# Patient Record
Sex: Female | Born: 1955
Health system: Southern US, Community
[De-identification: ages and names within clinical notes are randomized; demographics above are authoritative.]

## PROBLEM LIST (undated history)

## (undated) ENCOUNTER — Emergency Department (HOSPITAL_BASED_OUTPATIENT_CLINIC_OR_DEPARTMENT_OTHER): Admission: EM | Payer: No Typology Code available for payment source | Source: Home / Self Care

## (undated) DIAGNOSIS — I10 Essential (primary) hypertension: Secondary | ICD-10-CM

---

## 2006-11-01 ENCOUNTER — Emergency Department (HOSPITAL_COMMUNITY): Admission: EM | Admit: 2006-11-01 | Discharge: 2006-11-01 | Payer: Self-pay | Admitting: Emergency Medicine

## 2009-11-08 ENCOUNTER — Ambulatory Visit (HOSPITAL_COMMUNITY): Admission: RE | Admit: 2009-11-08 | Discharge: 2009-11-08 | Payer: Self-pay | Admitting: Family Medicine

## 2010-10-05 ENCOUNTER — Other Ambulatory Visit (HOSPITAL_COMMUNITY): Payer: Self-pay | Admitting: *Deleted

## 2010-11-23 ENCOUNTER — Other Ambulatory Visit (HOSPITAL_COMMUNITY): Payer: Self-pay | Admitting: Family Medicine

## 2010-11-23 DIAGNOSIS — Z1231 Encounter for screening mammogram for malignant neoplasm of breast: Secondary | ICD-10-CM

## 2010-12-06 ENCOUNTER — Ambulatory Visit (HOSPITAL_COMMUNITY)
Admission: RE | Admit: 2010-12-06 | Discharge: 2010-12-06 | Disposition: A | Discharge status: Home / Self Care | Payer: Self-pay | Source: Ambulatory Visit | Attending: Family Medicine | Admitting: Family Medicine

## 2010-12-06 DIAGNOSIS — Z1231 Encounter for screening mammogram for malignant neoplasm of breast: Secondary | ICD-10-CM

## 2011-01-01 ENCOUNTER — Emergency Department (HOSPITAL_BASED_OUTPATIENT_CLINIC_OR_DEPARTMENT_OTHER)
Admission: EM | Admit: 2011-01-01 | Discharge: 2011-01-01 | Disposition: A | Discharge status: Home / Self Care | Payer: Self-pay | Attending: Emergency Medicine | Admitting: Emergency Medicine

## 2011-01-01 ENCOUNTER — Other Ambulatory Visit: Payer: Self-pay

## 2011-01-01 ENCOUNTER — Encounter: Payer: Self-pay | Admitting: *Deleted

## 2011-01-01 ENCOUNTER — Emergency Department (INDEPENDENT_AMBULATORY_CARE_PROVIDER_SITE_OTHER): Payer: Self-pay

## 2011-01-01 DIAGNOSIS — R109 Unspecified abdominal pain: Secondary | ICD-10-CM | POA: Insufficient documentation

## 2011-01-01 DIAGNOSIS — R142 Eructation: Secondary | ICD-10-CM | POA: Insufficient documentation

## 2011-01-01 DIAGNOSIS — R143 Flatulence: Secondary | ICD-10-CM

## 2011-01-01 DIAGNOSIS — R141 Gas pain: Secondary | ICD-10-CM | POA: Insufficient documentation

## 2011-01-01 LAB — DIFFERENTIAL
Basophils Absolute: 0 10*3/uL (ref 0.0–0.1)
Basophils Relative: 1 % (ref 0–1)
Lymphs Abs: 2.1 10*3/uL (ref 0.7–4.0)
Neutro Abs: 1.7 10*3/uL (ref 1.7–7.7)
Neutrophils Relative %: 40 % — ABNORMAL LOW (ref 43–77)

## 2011-01-01 LAB — COMPREHENSIVE METABOLIC PANEL
ALT: 17 U/L (ref 0–35)
AST: 19 U/L (ref 0–37)
Albumin: 3.7 g/dL (ref 3.5–5.2)
BUN: 11 mg/dL (ref 6–23)
Calcium: 9.9 mg/dL (ref 8.4–10.5)
Total Protein: 7.2 g/dL (ref 6.0–8.3)

## 2011-01-01 LAB — CBC
HCT: 39.4 % (ref 36.0–46.0)
Hemoglobin: 13.5 g/dL (ref 12.0–15.0)
Platelets: 198 10*3/uL (ref 150–400)
RDW: 13.1 % (ref 11.5–15.5)
WBC: 4.3 10*3/uL (ref 4.0–10.5)

## 2011-01-01 LAB — URINALYSIS, ROUTINE W REFLEX MICROSCOPIC
Bilirubin Urine: NEGATIVE
Protein, ur: NEGATIVE mg/dL
Urobilinogen, UA: 0.2 mg/dL (ref 0.0–1.0)

## 2011-01-01 NOTE — ED Provider Notes (Signed)
History     CSN: [id number] Arrival date & time: 01/01/2011  9:46 AM  Chief Complaint  Patient presents with  . Bloated   Patient is a 55 y.o. female presenting with abdominal pain. The history is provided by the patient.  Abdominal Pain The primary symptoms of the illness include abdominal pain. The primary symptoms of the illness do not include fever, nausea or vomiting. Episode onset: Symptoms started about three weeks ago. She saw her PCP who could not find a cause, and ordered no treatments. The onset of the illness was gradual (She describes a vague discomfort of her entire abdomen, and a sense that she is gaining weight in the area of the upper abdomen and flank area). The problem has not changed since onset. The patient has not had a change in bowel habit. Symptoms associated with the illness do not include anorexia or constipation. Associated symptoms comments: Pain is worse after eating, better after a bowel movement - although she denies constipation or diarrhea. Pain is 0/10 currently, 2/10 at the worst..    History reviewed. No pertinent past medical history.  Past Surgical History  Procedure Date  . Cesarean section     History reviewed. No pertinent family history.  History  Substance Use Topics  . Smoking status: Never Smoker   . Smokeless tobacco: Not on file  . Alcohol Use: No    OB History    Grav Para Term Preterm Abortions TAB SAB Ect Mult Living                  Review of Systems  Constitutional: Negative for fever.  Gastrointestinal: Positive for abdominal pain. Negative for nausea, vomiting, constipation and anorexia.  All other systems reviewed and are negative.    Physical Exam  BP 147/87  Pulse 80  Temp(Src) 98.4 F (36.9 C) (Oral)  Resp 18  Ht 5\' 8"  (1.727 m)  Wt 158 lb (71.668 kg)  BMI 24.02 kg/m2  SpO2 100%  Physical Exam  Nursing note and vitals reviewed. Constitutional: She is oriented to person, place, and time. She appears  well-developed and well-nourished. No distress.  HENT:  Head: Normocephalic and atraumatic.  Right Ear: External ear normal.  Left Ear: External ear normal.  Nose: Nose normal.  Mouth/Throat: Oropharynx is clear and moist.  Eyes: Conjunctivae and EOM are normal. Pupils are equal, round, and reactive to light. No scleral icterus.  Neck: Normal range of motion. Neck supple. No JVD present.  Cardiovascular: Normal rate, regular rhythm and normal heart sounds.   No murmur heard. Pulmonary/Chest: Effort normal and breath sounds normal. She has no wheezes. She has no rales. She exhibits no tenderness.  Abdominal: Soft. Bowel sounds are normal. She exhibits no mass. There is tenderness. There is no rebound and no guarding.       Mild RUQ tenderness with +/- Murphy Sign.  Musculoskeletal: Normal range of motion. She exhibits no edema.  Lymphadenopathy:    She has no cervical adenopathy.  Neurological: She is alert and oriented to person, place, and time. She has normal reflexes. No cranial nerve deficit. Coordination normal.  Skin: Skin is warm and dry. No rash noted.  Psychiatric: She has a normal mood and affect.    ED Course  Procedures  Date: 01/01/2011  Rate: 70  Rhythm: normal sinus rhythm  QRS Axis: normal  Intervals: normal  ST/T Wave abnormalities: normal  Conduction Disutrbances:none Incomplete right bundle branch block  Narrative Interpretation: Incomplete right bundle branch  block, otherwise normal ECG  Old EKG Reviewed: none available   MDM Nonspecific abdominal pain, but with increased pain with eating, and RUQ tenderness, will get [place] to rule out cholelithiasis. Work-up unremarkable. Will treat for possible GERD or gastritis. Labs and x-rays reviewed, images viewed by me. Results for orders placed during the hospital encounter of 01/01/11  CBC      Component Value Range   WBC 4.3  4.0 - 10.5 (K/uL)   RBC 4.64  3.87 - 5.11 (MIL/uL)   Hemoglobin 13.5  12.0 - 15.0  (g/dL)   HCT 40.9  81.1 - 91.4 (%)   MCV 84.9  78.0 - 100.0 (fL)   MCH 29.1  26.0 - 34.0 (pg)   MCHC 34.3  30.0 - 36.0 (g/dL)   RDW 78.2  95.6 - 21.3 (%)   Platelets 198  150 - 400 (K/uL)  DIFFERENTIAL      Component Value Range   Neutrophils Relative 40 (*) 43 - 77 (%)   Neutro Abs 1.7  1.7 - 7.7 (K/uL)   Lymphocytes Relative 48 (*) 12 - 46 (%)   Lymphs Abs 2.1  0.7 - 4.0 (K/uL)   Monocytes Relative 10  3 - 12 (%)   Monocytes Absolute 0.4  0.1 - 1.0 (K/uL)   Eosinophils Relative 1  0 - 5 (%)   Eosinophils Absolute 0.1  0.0 - 0.7 (K/uL)   Basophils Relative 1  0 - 1 (%)   Basophils Absolute 0.0  0.0 - 0.1 (K/uL)  COMPREHENSIVE METABOLIC PANEL      Component Value Range   Sodium 142  135 - 145 (mEq/L)   Potassium 3.6  3.5 - 5.1 (mEq/L)   Chloride 105  96 - 112 (mEq/L)   CO2 29  19 - 32 (mEq/L)   Glucose, Bld 87  70 - 99 (mg/dL)   BUN 11  6 - 23 (mg/dL)   Creatinine, Ser 0.86  0.50 - 1.10 (mg/dL)   Calcium 9.9  8.4 - 57.8 (mg/dL)   Total Protein 7.2  6.0 - 8.3 (g/dL)   Albumin 3.7  3.5 - 5.2 (g/dL)   AST 19  0 - 37 (U/L)   ALT 17  0 - 35 (U/L)   Alkaline Phosphatase 56  39 - 117 (U/L)   Total Bilirubin 0.9  0.3 - 1.2 (mg/dL)   GFR calc non Af Amer >60  >60 (mL/min)   GFR calc Af Amer >60  >60 (mL/min)  LIPASE, BLOOD      Component Value Range   Lipase 24  11 - 59 (U/L)  URINALYSIS, ROUTINE W REFLEX MICROSCOPIC      Component Value Range   Color, Urine YELLOW  YELLOW    Appearance CLEAR  CLEAR    Specific Gravity, Urine 1.014  1.005 - 1.030    pH 7.0  5.0 - 8.0    Glucose, UA NEGATIVE  NEGATIVE (mg/dL)   Hgb urine dipstick TRACE (*) NEGATIVE    Bilirubin Urine NEGATIVE  NEGATIVE    Ketones, ur NEGATIVE  NEGATIVE (mg/dL)   Protein, ur NEGATIVE  NEGATIVE (mg/dL)   Urobilinogen, UA 0.2  0.0 - 1.0 (mg/dL)   Nitrite NEGATIVE  NEGATIVE    Leukocytes, UA NEGATIVE  NEGATIVE   URINE MICROSCOPIC-ADD ON      Component Value Range   RBC / HPF 0-2  <3 (RBC/hpf)   US Abdomen  Complete  01/01/2011  *RADIOLOGY REPORT*  Clinical Data:  Abdominal pain and bloating  ABDOMINAL ULTRASOUND COMPLETE  Comparison:  None.  Findings:  Gallbladder:  No gallstones, gallbladder wall thickening, or pericholecystic fluid. Negative sonographic Murphy's sign.  Common Bile Duct:  Within normal limits in caliber. Measures 1.2 mm.  Liver: No focal mass lesion identified.  Within normal limits in parenchymal echogenicity.  IVC:  Appears normal.  Pancreas:  No abnormality identified.  Spleen:  Within normal limits in size and echotexture.  Right kidney:  Normal in size and parenchymal echogenicity.  No evidence of mass or hydronephrosis.  Left kidney:  Normal in size and parenchymal echogenicity.  No evidence of mass or hydronephrosis.  Abdominal Aorta:  No aneurysm identified.  IMPRESSION: Negative abdominal ultrasound.  Original Report Authenticated By: Brandon Melnick, M.D.   Ms Digital Screening  12/07/2010  DG [facility] MAMMOGRAM Bilateral CC and MLO view(s) were taken.  DIGITAL SCREENING MAMMOGRAM WITH CAD: The breast tissue is extremely dense.  No masses or malignant type calcifications are identified.   Compared with prior studies.  Images were processed with CAD.  IMPRESSION: No specific mammographic evidence of malignancy.  Next screening mammogram is recommended in one  year.  A result letter of this screening mammogram will be mailed directly to the patient.  ASSESSMENT: Negative - BI-RADS 1  Screening mammogram in 1 year. ,      Dione Booze, MD 01/01/11 1354

## 2011-01-01 NOTE — ED Notes (Signed)
Pt amb to room 5 with quick steady gait smiling in nad.   Pt reports feeling bloated and like she has gained weight over the last week.  States her bm's are normal and she is passing gas, denies any fevers, n/v/d or other c/o.

## 2012-01-16 ENCOUNTER — Other Ambulatory Visit (HOSPITAL_COMMUNITY): Payer: Self-pay | Admitting: Family Medicine

## 2012-01-16 DIAGNOSIS — Z1231 Encounter for screening mammogram for malignant neoplasm of breast: Secondary | ICD-10-CM

## 2012-02-01 ENCOUNTER — Ambulatory Visit (HOSPITAL_COMMUNITY)
Admission: RE | Admit: 2012-02-01 | Discharge: 2012-02-01 | Disposition: A | Discharge status: Home / Self Care | Payer: Self-pay | Source: Ambulatory Visit | Attending: Family Medicine | Admitting: Family Medicine

## 2012-02-01 DIAGNOSIS — Z1231 Encounter for screening mammogram for malignant neoplasm of breast: Secondary | ICD-10-CM

## 2012-02-26 DIAGNOSIS — H9192 Unspecified hearing loss, left ear: Secondary | ICD-10-CM | POA: Insufficient documentation

## 2012-07-13 IMAGING — US US ABDOMEN COMPLETE
1 series · 14 of 25 positions shown · non-contrast
Comparison: None.

CLINICAL DATA: Abdominal pain and bloating

ABDOMINAL ULTRASOUND COMPLETE

[Series 1: us abdomen complete · 0.26mm/px · 14 of 73 slices shown]
[im 1/73]
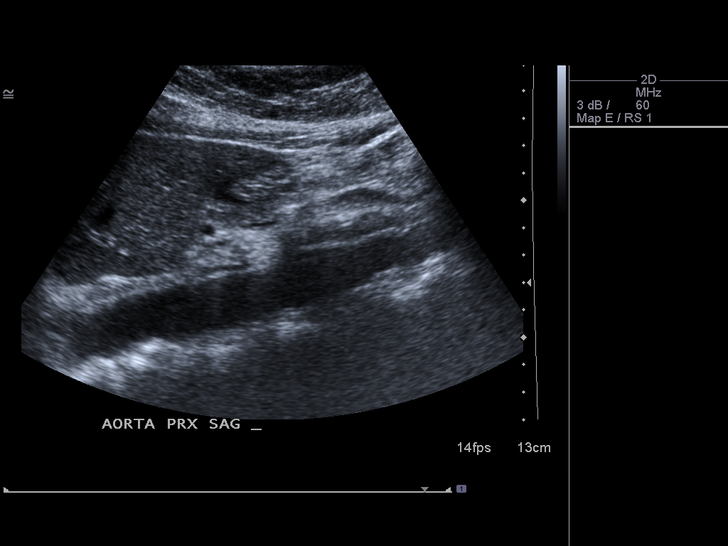
[im 7/73]
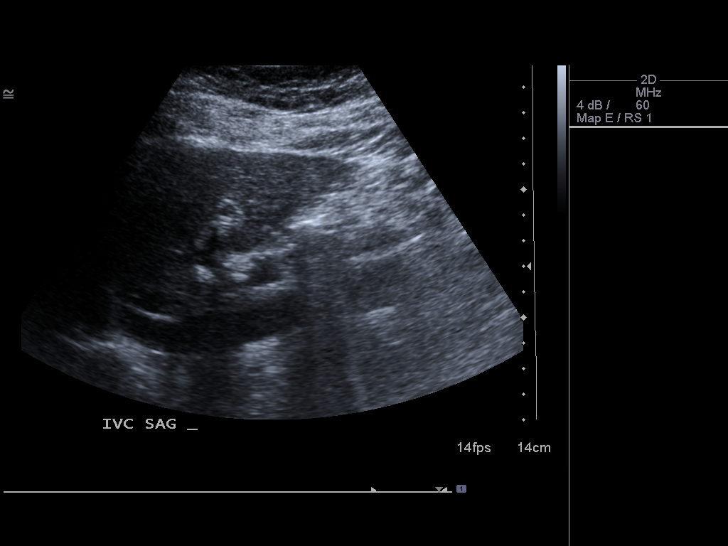
[im 13/73]
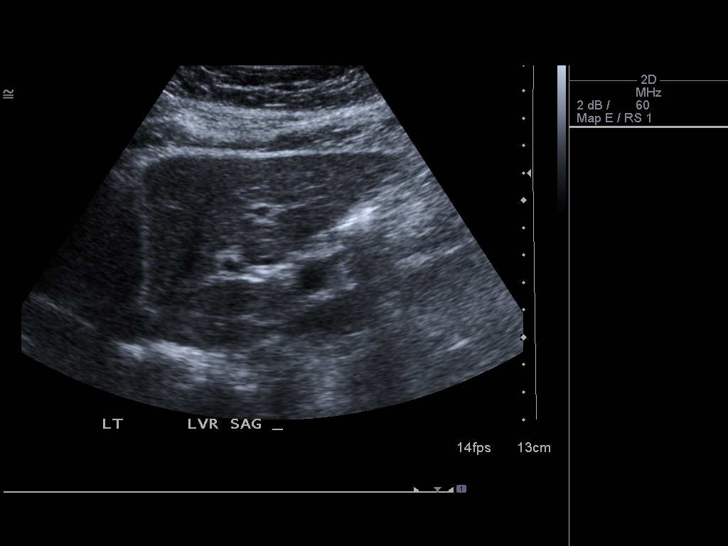
[im 19/73]
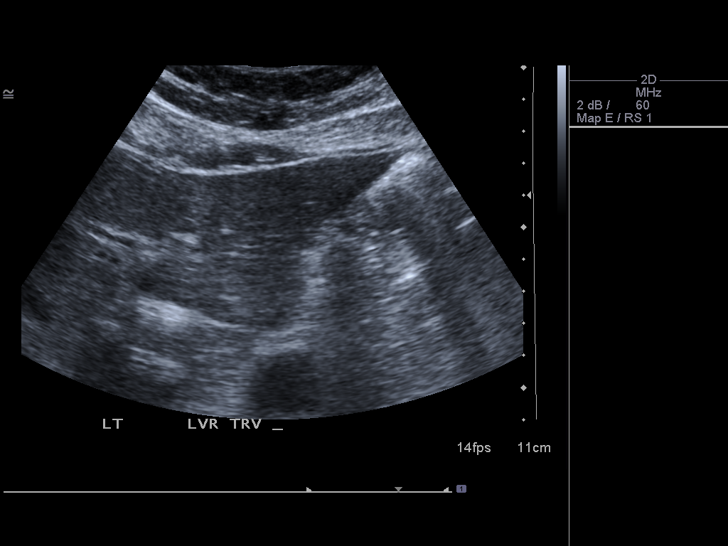
[im 25/73]
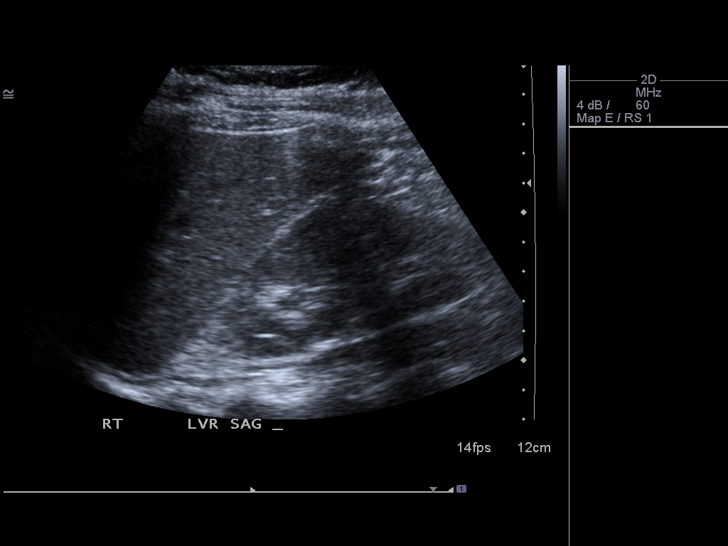
[im 28/73]
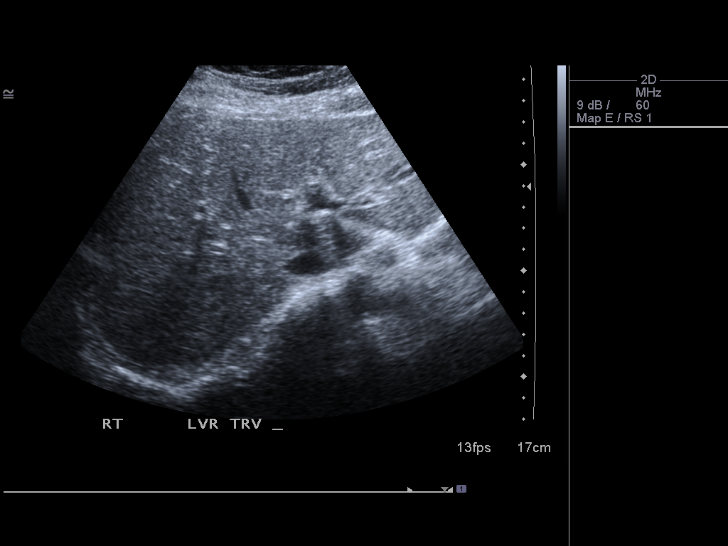
[im 34/73]
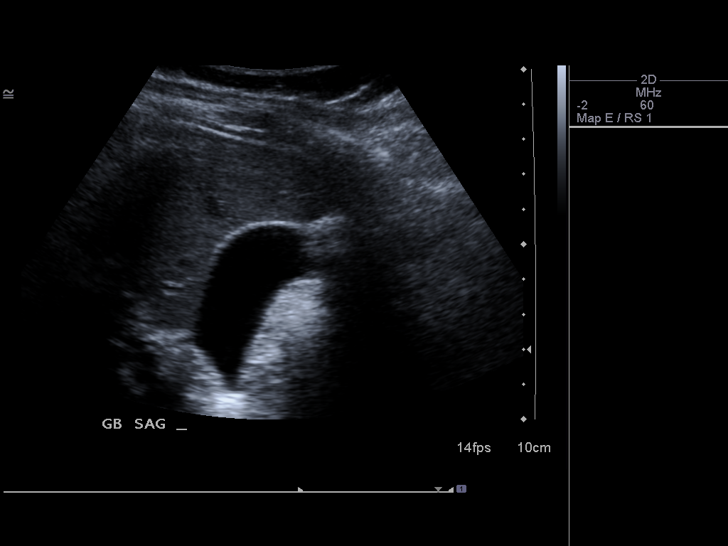
[im 40/73]
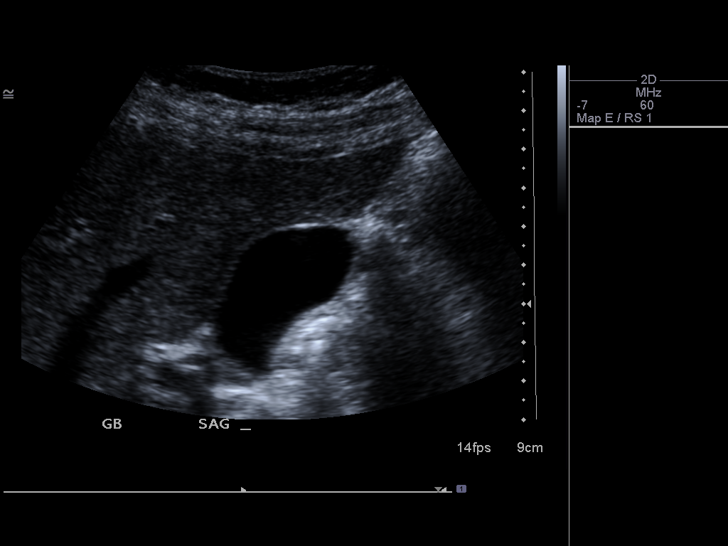
[im 46/73]
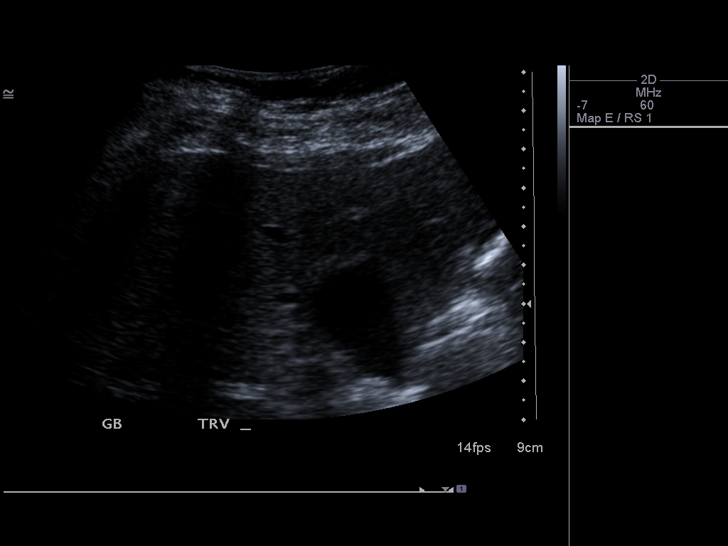
[im 49/73]
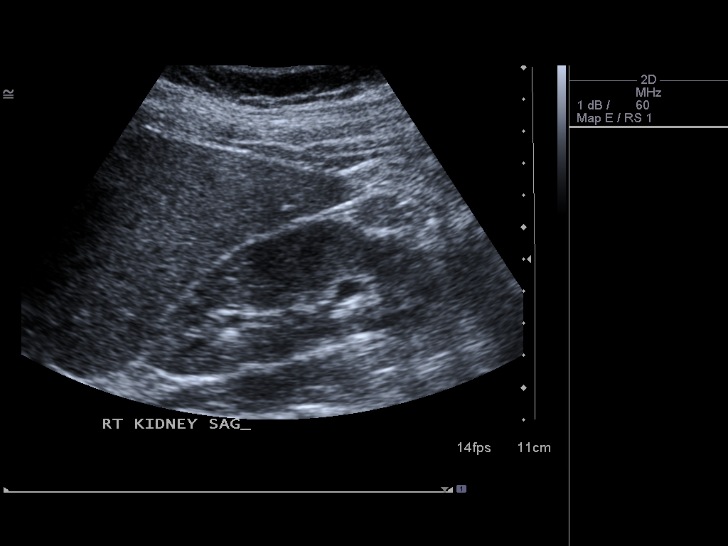
[im 55/73]
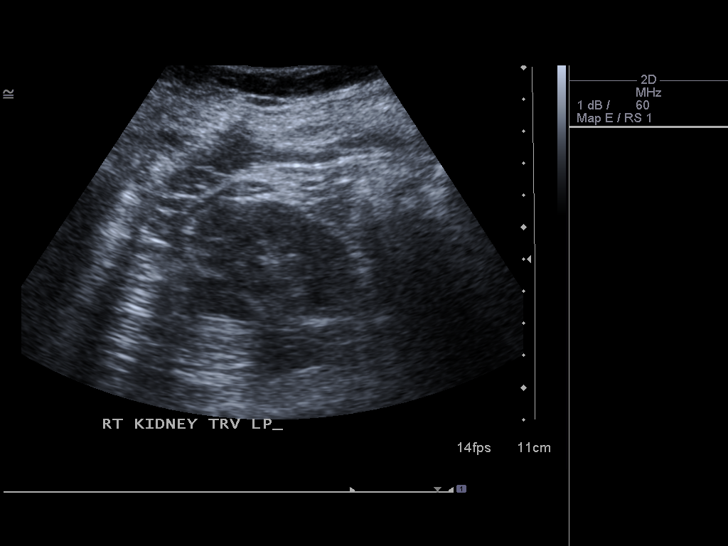
[im 61/73]
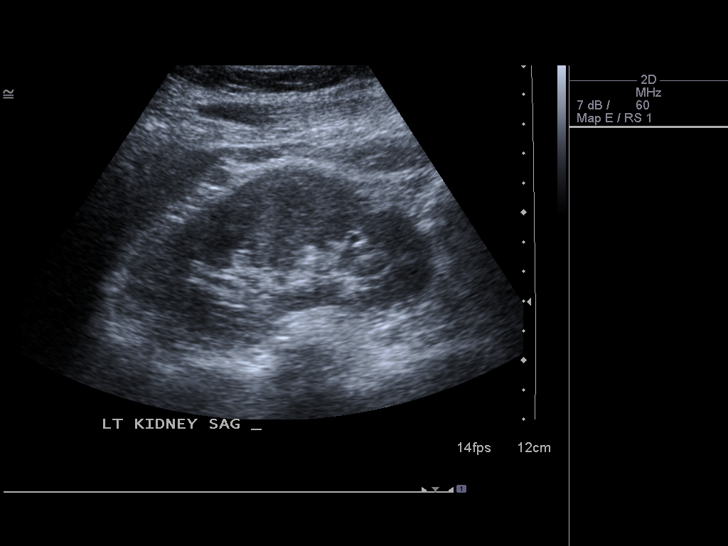
[im 67/73]
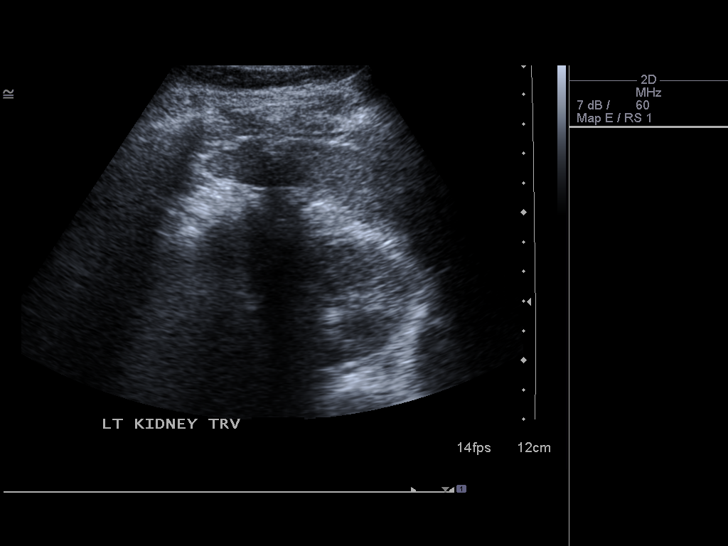
[im 73/73]
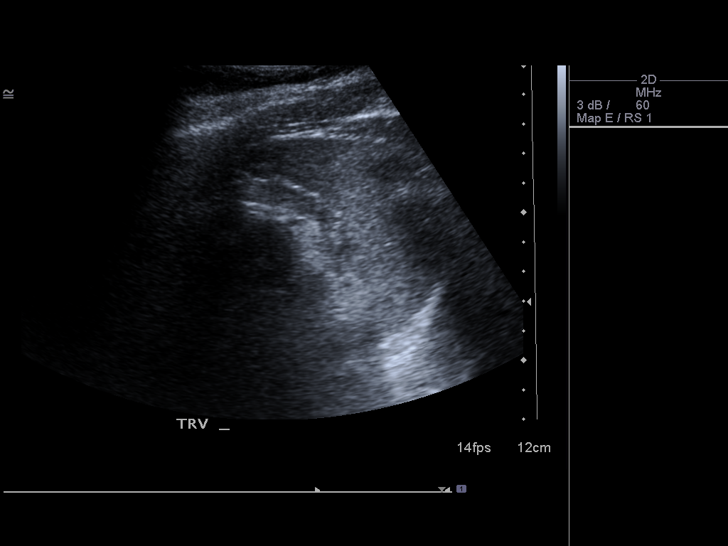

[14 of 25 positions shown; findings below may reference images not displayed]

FINDINGS: Gallbladder:  No gallstones, gallbladder wall thickening, or
pericholecystic fluid. Negative sonographic Murphy's sign.

Common Bile Duct:  Within normal limits in caliber. Measures
mm.

Liver: No focal mass lesion identified.  Within normal limits in
parenchymal echogenicity.

IVC:  Appears normal.

Pancreas:  No abnormality identified.

Spleen:  Within normal limits in size and echotexture.

Right kidney:  Normal in size and parenchymal echogenicity.  No
evidence of mass or hydronephrosis.

Left kidney:  Normal in size and parenchymal echogenicity.  No
evidence of mass or hydronephrosis.

Abdominal Aorta:  No aneurysm identified.
IMPRESSION: Negative abdominal ultrasound.

## 2012-12-30 ENCOUNTER — Other Ambulatory Visit (HOSPITAL_COMMUNITY): Payer: Self-pay | Admitting: Family Medicine

## 2012-12-30 DIAGNOSIS — Z1231 Encounter for screening mammogram for malignant neoplasm of breast: Secondary | ICD-10-CM

## 2013-02-02 ENCOUNTER — Ambulatory Visit (HOSPITAL_COMMUNITY)
Admission: RE | Admit: 2013-02-02 | Discharge: 2013-02-02 | Disposition: A | Discharge status: Home / Self Care | Payer: No Typology Code available for payment source | Source: Ambulatory Visit | Attending: Family Medicine | Admitting: Family Medicine

## 2013-02-02 DIAGNOSIS — Z1231 Encounter for screening mammogram for malignant neoplasm of breast: Secondary | ICD-10-CM | POA: Insufficient documentation

## 2014-02-24 ENCOUNTER — Other Ambulatory Visit (HOSPITAL_COMMUNITY): Payer: Self-pay | Admitting: *Deleted

## 2014-02-24 DIAGNOSIS — Z1231 Encounter for screening mammogram for malignant neoplasm of breast: Secondary | ICD-10-CM

## 2014-02-26 ENCOUNTER — Ambulatory Visit (HOSPITAL_COMMUNITY)
Admission: RE | Admit: 2014-02-26 | Discharge: 2014-02-26 | Disposition: A | Discharge status: Home / Self Care | Payer: 59 | Source: Ambulatory Visit | Attending: *Deleted | Admitting: *Deleted

## 2014-02-26 DIAGNOSIS — Z1231 Encounter for screening mammogram for malignant neoplasm of breast: Secondary | ICD-10-CM | POA: Insufficient documentation

## 2014-04-04 ENCOUNTER — Emergency Department (HOSPITAL_BASED_OUTPATIENT_CLINIC_OR_DEPARTMENT_OTHER)
Admission: EM | Admit: 2014-04-04 | Discharge: 2014-04-04 | Disposition: A | Discharge status: Home / Self Care | Payer: 59 | Attending: Emergency Medicine | Admitting: Emergency Medicine

## 2014-04-04 ENCOUNTER — Encounter (HOSPITAL_BASED_OUTPATIENT_CLINIC_OR_DEPARTMENT_OTHER): Payer: Self-pay | Admitting: *Deleted

## 2014-04-04 DIAGNOSIS — M79604 Pain in right leg: Secondary | ICD-10-CM | POA: Diagnosis present

## 2014-04-04 DIAGNOSIS — Z7951 Long term (current) use of inhaled steroids: Secondary | ICD-10-CM | POA: Diagnosis not present

## 2014-04-04 DIAGNOSIS — Z79899 Other long term (current) drug therapy: Secondary | ICD-10-CM | POA: Insufficient documentation

## 2014-04-04 DIAGNOSIS — Z791 Long term (current) use of non-steroidal anti-inflammatories (NSAID): Secondary | ICD-10-CM | POA: Insufficient documentation

## 2014-04-04 DIAGNOSIS — Z792 Long term (current) use of antibiotics: Secondary | ICD-10-CM | POA: Insufficient documentation

## 2014-04-04 DIAGNOSIS — M7651 Patellar tendinitis, right knee: Secondary | ICD-10-CM | POA: Diagnosis not present

## 2014-04-04 DIAGNOSIS — M791 Myalgia: Secondary | ICD-10-CM | POA: Insufficient documentation

## 2014-04-04 DIAGNOSIS — I10 Essential (primary) hypertension: Secondary | ICD-10-CM | POA: Diagnosis not present

## 2014-04-04 DIAGNOSIS — M76899 Other specified enthesopathies of unspecified lower limb, excluding foot: Secondary | ICD-10-CM

## 2014-04-04 HISTORY — DX: Essential (primary) hypertension: I10

## 2014-04-04 MED ORDER — TRAMADOL HCL 50 MG PO TABS: 50.0000 mg | ORAL_TABLET | Freq: Four times a day (QID) | ORAL | Status: DC | PRN

## 2014-04-04 MED ORDER — IBUPROFEN 800 MG PO TABS: 800.0000 mg | ORAL_TABLET | Freq: Three times a day (TID) | ORAL | Status: DC

## 2014-04-04 NOTE — ED Provider Notes (Signed)
CSN: [id number]     Arrival date & time 04/04/14  1450 History  This chart was scribed for Gilda Creasehristopher J. Brogan Martis, * by Roxy Cedarhandni Bhalodia, ED Scribe. This patient was seen in room MH06/MH06 and the patient's care was started at 3:06 PM.   Chief Complaint  Patient presents with  . Leg Pain   Patient is a 58 y.o. female presenting with leg pain. The history is provided by the patient. No language interpreter was used.  Leg Pain   HPI Comments: Alexa Hernandez is a 58 y.o. female who presents to the Emergency Department complaining of right leg pain that began a few days ago. She states that she feels the most pain in her anterior side of her lower right leg. She states that she experiences pain in her upper leg in right thigh when she walks. She states that she walks a lot while at work. She denies history of blood clot. She denies swelling to affected area. She denies any injury to right leg.  Past Medical History  Diagnosis Date  . Hypertension    Past Surgical History  Procedure Laterality Date  . Cesarean section     No family history on file. History  Substance Use Topics  . Smoking status: Never Smoker   . Smokeless tobacco: Never Used  . Alcohol Use: No   OB History    No data available     Review of Systems  Musculoskeletal: Positive for myalgias and arthralgias.       Right leg pain  All other systems reviewed and are negative.  Allergies  Review of patient's allergies indicates no known allergies.  Home Medications   Prior to Admission medications   Medication Sig Start Date End Date Taking? Authorizing Provider  ferrous fumarate (FERRO-SEQUELS) 50 MG CR tablet Take 50 mg by mouth 3 (three) times daily with meals.     Yes Historical Provider, MD  fluticasone (FLONASE) 50 MCG/ACT nasal spray Place into both nostrils daily.   Yes Historical Provider, MD  hydrochlorothiazide (HYDRODIURIL) 12.5 MG tablet Take 12.5 mg by mouth daily.     Yes Historical Provider, MD   meloxicam (MOBIC) 15 MG tablet Take 15 mg by mouth daily.   Yes Historical Provider, MD  methocarbamol (ROBAXIN) 500 MG tablet Take 500 mg by mouth 4 (four) times daily.   Yes Historical Provider, MD  omeprazole (PRILOSEC) 10 MG capsule Take 10 mg by mouth daily.     Yes Historical Provider, MD  simvastatin (ZOCOR) 10 MG tablet Take 10 mg by mouth at bedtime.     Yes Historical Provider, MD  esterified estrogens (MENEST) 0.625 MG tablet Take 0.625 mg by mouth daily.      Historical Provider, MD  ibuprofen (ADVIL,MOTRIN) 800 MG tablet Take 1 tablet (800 mg total) by mouth 3 (three) times daily. 04/04/14   Gilda Creasehristopher J. Chidinma Clites, MD  traMADol (ULTRAM) 50 MG tablet Take 1 tablet (50 mg total) by mouth every 6 (six) hours as needed. 04/04/14   Gilda Creasehristopher J. Shareeka Yim, MD   Triage Vitals: BP 138/80 mmHg  Pulse 76  Temp(Src) 98.1 F (36.7 C) (Oral)  Resp 18  Ht 5\' 6"  (1.676 m)  Wt 149 lb (67.586 kg)  BMI 24.06 kg/m2  SpO2 100%  Physical Exam  Constitutional: She is oriented to person, place, and time. She appears well-developed and well-nourished. No distress.  HENT:  Head: Normocephalic and atraumatic.  Right Ear: Hearing normal.  Left Ear: Hearing normal.  Nose: Nose  normal.  Mouth/Throat: Oropharynx is clear and moist and mucous membranes are normal.  Eyes: Conjunctivae and EOM are normal. Pupils are equal, round, and reactive to light.  Neck: Normal range of motion. Neck supple.  Cardiovascular: Regular rhythm, S1 normal and S2 normal.  Exam reveals no gallop and no friction rub.   No murmur heard. Pulmonary/Chest: Effort normal and breath sounds normal. No respiratory distress. She exhibits no tenderness.  Abdominal: Soft. Normal appearance and bowel sounds are normal. There is no hepatosplenomegaly. There is no tenderness. There is no rebound, no guarding, no tenderness at McBurney's point and negative Murphy's sign. No hernia.  Musculoskeletal: Normal range of motion. She exhibits no  edema.  Dorsalis pedal pulse 2+. No calf tenderness or swelling. Negative homans sign. Pain in distal thigh and proximal tibia with active extension of the knee but not with passive.  Neurological: She is alert and oriented to person, place, and time. She has normal strength. No cranial nerve deficit or sensory deficit. Coordination normal. GCS eye subscore is 4. GCS verbal subscore is 5. GCS motor subscore is 6.  Skin: Skin is warm, dry and intact. No rash noted. No cyanosis or erythema.  No redness, swelling or skin changes.  Psychiatric: She has a normal mood and affect. Her speech is normal and behavior is normal. Thought content normal.  Nursing note and vitals reviewed.  ED Course  Procedures (including critical care time)  DIAGNOSTIC STUDIES: Oxygen Saturation is 100% on RA, normal by my interpretation.    COORDINATION OF CARE: 3:14 PM- Discussed plans to give patient ibuprofen and tramadol. Pt advised of plan for treatment and pt agrees.  Labs Review Labs Reviewed - No data to display  Imaging Review No results found.   EKG Interpretation None     MDM   Final diagnoses:  Quadriceps tendinitis  Patellar tendinitis of right knee   She presents to the ER for evaluation of right leg pain. Patient tells me that she walks and is on her feet a lot at work. Patient is complaining of pain in the anterior lower thigh and the area just below the knee. There is no calf pain or swelling. No calf tenderness. No concern for DVT based on her examination. Patient has palpable pulses and normal neurovascular function. There are no signs of infection. Patient has increased pain with extension of the knee against resistance but no pain with passive range of motion. This is consistent with patellar tendinitis.  I personally performed the services described in this documentation, which was scribed in my presence. The recorded information has been reviewed and is accurate.  Gilda Creasehristopher J.  Elizebeth Kluesner, MD 04/04/14 1531

## 2014-04-04 NOTE — ED Notes (Signed)
Right leg pain x 1 day- no injury

## 2014-04-04 NOTE — Discharge Instructions (Signed)
Patellar Tendinitis, Jumper's Knee with Rehab Tendinitis is inflammation of a tendon. Tendonitis of the tendon below the kneecap (patella) is known as patellar tendonitis. Patellar tendonitis is a common cause of pain below the kneecap (infrapatellar). Patellar tendonitis may involve a tear (strain) in the ligament. Strains are classified into three categories. Grade 1 strains cause pain, but the tendon is not lengthened. Grade 2 strains include a lengthened ligament, due to the ligament being stretched or partially ruptured. With grade 2 strains there is still function, although function may be decreased. Grade 3 strains involve a complete tear of the tendon or muscle, and function is usually impaired. Patellar tendon strains are usually grade 1 or 2.  SYMPTOMS   Pain, tenderness, swelling, warmth, or redness over the patellar tendon (just below the kneecap).  Pain and loss of strength (sometimes), with forcefully straightening the knee (especially when jumping or rising from a seated or squatting position), or bending the knee completely (squatting or kneeling).  Crackling sound (crepitation) when the tendon is moved or touched. CAUSES  Patellar tendonitis is caused by injury to the patellar tendon. The inflammation is the body's healing response. Common causes of injury include:  Stress from a sudden increase in intensity, frequency, or duration of training.  Overuse of the thigh muscles (quadriceps) and patellar tendon.  Direct hit (trauma) to the knee or patellar tendon. RISK INCREASES WITH:  Sports that require sudden, explosive quadriceps contraction, such as jumping, quick starts, or kicking.  Running sports, especially running down hills.  Poor strength and flexibility of the thigh and knee.  Flat feet. PREVENTION  Warm up and stretch properly before activity.  Allow for adequate recovery between workouts.  Maintain physical fitness:  Strength, flexibility, and  endurance.  Cardiovascular fitness.  Protect the knee joint with taping, protective strapping, bracing, or elastic compression bandage.  Wear arch supports (orthotics). PROGNOSIS  If treated properly, patellar tendonitis usually heals within 6 weeks.  RELATED COMPLICATIONS   Longer healing time if not properly treated or if not given enough time to heal.  Recurring symptoms if activity is resumed too soon, with overuse, with a direct blow, or when using poor technique.  If untreated, tendon rupture requiring surgery. TREATMENT Treatment first involves the use of ice and medicine to reduce pain and inflammation. The use of strengthening and stretching exercises may help reduce pain with activity. These exercises may be performed at home or with a therapist. Serious cases of tendonitis may require restraining the knee for 10 to 14 days to prevent stress on the tendon and to promote healing. Crutches may be used (uncommon) until you can walk without a limp. For cases in which nonsurgical treatment is unsuccessful, surgery may be advised to remove the inflamed tendon lining (sheath). Surgery is rare, and is only advised after at least 6 months of nonsurgical treatment. MEDICATION   If pain medicine is needed, nonsteroidal anti-inflammatory medicines (aspirin and ibuprofen), or other minor pain relievers (acetaminophen), are often advised.  Do not take pain medicine for 7 days before surgery.  Prescription pain relievers may be given if your caregiver thinks they are needed. Use only as directed and only as much as you need. HEAT AND COLD  Cold treatment (icing) should be applied for 10 to 15 minutes every 2 to 3 hours for inflammation and pain, and immediately after activity that aggravates your symptoms. Use ice packs or an ice massage.  Heat treatment may be used before performing stretching and strengthening activities   prescribed by your caregiver, physical therapist, or athletic trainer.  Use a heat pack or a warm water soak. SEEK MEDICAL CARE IF:  Symptoms get worse or do not improve in 2 weeks, despite treatment.  New, unexplained symptoms develop. (Drugs used in treatment may produce side effects.) EXERCISES RANGE OF MOTION (ROM) AND STRETCHING EXERCISES - Patellar Tendinitis (Jumper's Knee) These are some of the initial exercises with which you may start your rehabilitation program, until you see your caregiver again or until your symptoms are resolved. Remember:   Flexible tissue is more tolerant of the stresses placed on it during activities.  Each stretch should be held for 20 to 30 seconds.  A gentle stretching sensation should be felt. STRETCH - Hamstrings, Supine  Lie on your back. Loop a belt or towel over the ball of your right / left foot.  Straighten your right / left knee and slowly pull on the belt to raise your leg. Do not allow the right / left knee to bend. Keep your opposite leg flat on the floor.  Raise the leg until you feel a gentle stretch behind your right / left knee or thigh. Hold this position for __________ seconds. Repeat __________ times. Complete this stretch __________ times per day.  STRETCH - Hamstrings, Doorway  Lie on your back with your right / left leg extended and resting on the wall, and the opposite leg flat on the ground through the door. At first, position your bottom farther away from the wall.  Keep your right / left knee straight. If you feel a stretch behind your knee or thigh, hold this position for __________ seconds.  If you do not feel a stretch, scoot your bottom closer to the door, and hold __________ seconds. Repeat __________ times. Complete this stretch __________ times per day.  STRETCH - Hamstrings, Standing  Stand or sit and extend your right / left leg, placing your foot on a chair or foot stool.  Keep a slight arch in your low back and your hips straight forward.  Lead with your chest and lean forward  at the waist until you feel a gentle stretch in the back of your right / left knee or thigh. (When done correctly, this exercise requires leaning only a small distance.)  Hold this position for __________ seconds. Repeat __________ times. Complete this stretch __________ times per day. STRETCH - Adductors, Lunge  While standing, spread your legs, with your right / left leg behind you.  Lean away from your right / left leg by bending your opposite knee. You may rest your hands on your thigh for balance.  You should feel a stretch in your right / left inner thigh. Hold for __________ seconds. Repeat __________ times. Complete this exercise __________ times per day.  STRENGTHENING EXERCISES - Patellar Tendinitis (Jumper's Knee) These exercises may help you when beginning to rehabilitate your injury. They may resolve your symptoms with or without further involvement from your physician, physical therapist or athletic trainer. While completing these exercises, remember:   Muscles can gain both the endurance and the strength needed for everyday activities through controlled exercises.  Complete these exercises as instructed by your physician, physical therapist or athletic trainer. Increase the resistance and repetitions only as guided by your caregiver. STRENGTH - Quadriceps, Isometrics  Lie on your back with your right / left leg extended and your opposite knee bent.  Gradually tense the muscles in the front of your right / left thigh. You should see   either your kneecap slide up toward your hip or increased dimpling just above the knee. This motion will push the back of the knee down toward the floor, mat, or bed on which you are lying.  Hold the muscle as tight as you can, without increasing your pain, for __________ seconds.  Relax the muscles slowly and completely in between each repetition. Repeat __________ times. Complete this exercise __________ times per day.  STRENGTH - Quadriceps,  Short Arcs  Lie on your back. Place a __________ inch towel roll under your right / left knee, so that the knee bends slightly.  Raise only your lower leg by tightening the muscles in the front of your thigh. Do not allow your thigh to rise.  Hold this position for __________ seconds. Repeat __________ times. Complete this exercise __________ times per day.  OPTIONAL ANKLE WEIGHTS: Begin with ____________________, but DO NOT exceed ____________________. Increase in 1 pound/ 0.5 kilogram increments. STRENGTH - Quadriceps, Straight Leg Raises  Quality counts! Watch for signs that the quadriceps muscle is working, to be sure you are strengthening the correct muscles and not "cheating" by substituting with healthier muscles.  Lay on your back with your right / left leg extended and your opposite knee bent.  Tense the muscles in the front of your right / left thigh. You should see either your kneecap slide up or increased dimpling just above the knee. Your thigh may even shake a bit.  Tighten these muscles even more and raise your leg 4 to 6 inches off the floor. Hold for __________ seconds.  Keeping these muscles tense, lower your leg.  Relax the muscles slowly and completely between each repetition. Repeat __________ times. Complete this exercise __________ times per day.  STRENGTH - Quadriceps, Squats  Stand in a door frame so that your feet and knees are in line with the frame.  Use your hands for balance, not support, on the frame.  Slowly lower your weight, bending at the hips and knees. Keep your lower legs upright so that they are parallel with the door frame. Squat only within the range that does not increase your knee pain. Never let your hips drop below your knees.  Slowly return upright, pushing with your legs, not pulling with your hands. Repeat __________ times. Complete this exercise __________ times per day.  STRENGTH - Quadriceps, Step-Downs  Stand on the edge of a step  stool or stair. Be prepared to use a countertop or wall for balance, if needed.  Keeping your right / left knee directly over the middle of your foot, slowly touch your opposite heel to the floor or lower step. Do not go all the way to the floor if your knee pain increases; just go as far as you can without increased discomfort. Use your right / left leg muscles, not gravity to lower your body weight.  Slowly push your body weight back up to the starting position. Repeat __________ times. Complete this exercise __________ times per day.  Document Released: 05/14/2005 Document Revised: 09/28/2013 Document Reviewed: 08/26/2008 [employer] Patient Information 2015 [employer]. This information is not intended to replace advice given to you by your health care provider. Make sure you discuss any questions you have with your health care provider.  

## 2014-04-14 ENCOUNTER — Ambulatory Visit (INDEPENDENT_AMBULATORY_CARE_PROVIDER_SITE_OTHER): Payer: 59 | Admitting: Family Medicine

## 2014-04-14 ENCOUNTER — Encounter (INDEPENDENT_AMBULATORY_CARE_PROVIDER_SITE_OTHER): Payer: Self-pay

## 2014-04-14 ENCOUNTER — Encounter: Payer: Self-pay | Admitting: Family Medicine

## 2014-04-14 VITALS — BP 155/83 | HR 73 | Ht 67.0 in | Wt 145.0 lb

## 2014-04-14 DIAGNOSIS — M25561 Pain in right knee: Secondary | ICD-10-CM

## 2014-04-14 MED ORDER — METHYLPREDNISOLONE ACETATE 40 MG/ML IJ SUSP
40.0000 mg | Freq: Once | INTRAMUSCULAR | Status: AC
  Administered 2014-04-14: 40 mg via INTRA_ARTICULAR

## 2014-04-14 NOTE — Patient Instructions (Signed)
Your knee pain is due to mild arthritis and/or muscle spasms. Below are the medications that may help with pain: Take tylenol 500mg  1-2 tabs three times a day for pain. Aleve 1-2 tabs twice a day with food Glucosamine sulfate 750mg  twice a day is a supplement that may help. Capsaicin topically up to four times a day may also help with pain. Cortisone injections are an option - you were given one of these today. If cortisone injections do not help, there are different types of shots that may help but they take longer to take effect. It's important that you continue to stay active. Straight leg raises, knee extensions 3 sets of 10 once a day (add ankle weight if these become too easy). Consider physical therapy to strengthen muscles around the joint that hurts to take pressure off of the joint itself. Shoe inserts with good arch support may be helpful. Heat or ice 15 minutes at a time 3-4 times a day as needed to help with pain. Water aerobics and cycling with low resistance are the best two types of exercise. Follow up with me in 1 month.

## 2014-04-15 DIAGNOSIS — M25561 Pain in right knee: Secondary | ICD-10-CM | POA: Insufficient documentation

## 2014-04-15 NOTE — Progress Notes (Signed)
PCP: No primary care provider on file.  Subjective:   HPI: Patient is a 58 y.o. female here for right knee pain.  Patient denies known injury. She states she's had about 1 month of worsening right knee pain. Pain is from distal quad to below the knee. Worse by end of day. Is on feet a lot at work at [employer]. No catching, locking, giving out of knee. Wearing a brace. Taking ibuprofen and pain medication.  Past Medical History  Diagnosis Date  . Hypertension     Current Outpatient Prescriptions on File Prior to Visit  Medication Sig Dispense Refill  . esterified estrogens (MENEST) 0.625 MG tablet Take 0.625 mg by mouth daily.      . ferrous fumarate (FERRO-SEQUELS) 50 MG CR tablet Take 50 mg by mouth 3 (three) times daily with meals.      . fluticasone (FLONASE) 50 MCG/ACT nasal spray Place into both nostrils daily.    . hydrochlorothiazide (HYDRODIURIL) 12.5 MG tablet Take 12.5 mg by mouth daily.      Marland Kitchen. ibuprofen (ADVIL,MOTRIN) 800 MG tablet Take 1 tablet (800 mg total) by mouth 3 (three) times daily. 30 tablet 0  . meloxicam (MOBIC) 15 MG tablet Take 15 mg by mouth daily.    . methocarbamol (ROBAXIN) 500 MG tablet Take 500 mg by mouth 4 (four) times daily.    Marland Kitchen. omeprazole (PRILOSEC) 10 MG capsule Take 10 mg by mouth daily.      . simvastatin (ZOCOR) 10 MG tablet Take 10 mg by mouth at bedtime.      . traMADol (ULTRAM) 50 MG tablet Take 1 tablet (50 mg total) by mouth every 6 (six) hours as needed. 20 tablet 0   No current facility-administered medications on file prior to visit.    Past Surgical History  Procedure Laterality Date  . Cesarean section      No Known Allergies  History   Social History  . Marital Status: Married    Spouse Name: N/A    Number of Children: N/A  . Years of Education: N/A   Occupational History  . Not on file.   Social History Main Topics  . Smoking status: Never Smoker   . Smokeless tobacco: Never Used  . Alcohol Use: No  .  Drug Use: No  . Sexual Activity: Not on file   Other Topics Concern  . Not on file   Social History Narrative    No family history on file.  BP 155/83 mmHg  Pulse 73  Ht 5\' 7"  (1.702 m)  Wt 145 lb (65.772 kg)  BMI 22.71 kg/m2  Review of Systems: See HPI above.    Objective:  Physical Exam:  Gen: NAD  Right knee: No gross deformity, ecchymoses, swelling. Mild medial joint line tenderness. FROM. Negative ant/post drawers. Negative valgus/varus testing. Negative lachmanns. Negative mcmurrays, apleys, patellar apprehension. NV intact distally.    Assessment & Plan:  1. Right knee pain - 2/2 mild DJD with associated secondary spasms.  Discussed tylenol, nsaids, glucosamine, capsaicin.  Injection given today.  HEP reviewed.  Consider physical therapy - declined at this time.  F/u in 1 month.  After informed written consent, patient was seated on exam table. Right knee was prepped with alcohol swab and utilizing anteromedial approach, patient's right knee was injected intraarticularly with 3:1 marcaine: depomedrol. Patient tolerated the procedure well without immediate complications.

## 2014-04-15 NOTE — Assessment & Plan Note (Signed)
2/2 mild DJD with associated secondary spasms.  Discussed tylenol, nsaids, glucosamine, capsaicin.  Injection given today.  HEP reviewed.  Consider physical therapy - declined at this time.  F/u in 1 month.  After informed written consent, patient was seated on exam table. Right knee was prepped with alcohol swab and utilizing anteromedial approach, patient's right knee was injected intraarticularly with 3:1 marcaine: depomedrol. Patient tolerated the procedure well without immediate complications.

## 2014-04-27 ENCOUNTER — Telehealth: Payer: Self-pay | Admitting: Family Medicine

## 2014-04-27 NOTE — Telephone Encounter (Signed)
I would recommend physical therapy then following up with me in 1 month.

## 2014-04-27 NOTE — Addendum Note (Signed)
Addended by: Kathi SimpersWISE, Terral Cooks F on: 04/27/2014 02:01 PM   Modules accepted: Orders

## 2014-04-27 NOTE — Telephone Encounter (Signed)
Spoke with patient and will be sending referral for physical therapy downstairs. Will also schedule patient a follow up appointment in 1 month.

## 2014-12-10 ENCOUNTER — Other Ambulatory Visit: Payer: Self-pay | Admitting: Internal Medicine

## 2015-02-16 ENCOUNTER — Other Ambulatory Visit: Payer: Self-pay

## 2015-02-16 DIAGNOSIS — Z1231 Encounter for screening mammogram for malignant neoplasm of breast: Secondary | ICD-10-CM

## 2015-03-03 ENCOUNTER — Ambulatory Visit
Admission: RE | Admit: 2015-03-03 | Discharge: 2015-03-03 | Disposition: A | Discharge status: Home / Self Care | Payer: 59 | Source: Ambulatory Visit

## 2015-03-03 DIAGNOSIS — Z1231 Encounter for screening mammogram for malignant neoplasm of breast: Secondary | ICD-10-CM

## 2015-06-24 DIAGNOSIS — K219 Gastro-esophageal reflux disease without esophagitis: Secondary | ICD-10-CM | POA: Insufficient documentation

## 2015-08-07 DIAGNOSIS — E042 Nontoxic multinodular goiter: Secondary | ICD-10-CM | POA: Insufficient documentation

## 2015-11-16 DIAGNOSIS — D704 Cyclic neutropenia: Secondary | ICD-10-CM | POA: Insufficient documentation

## 2015-12-29 ENCOUNTER — Emergency Department (HOSPITAL_BASED_OUTPATIENT_CLINIC_OR_DEPARTMENT_OTHER)
Admission: EM | Admit: 2015-12-29 | Discharge: 2015-12-29 | Disposition: A | Discharge status: Home / Self Care | Payer: 59 | Attending: Physician Assistant | Admitting: Physician Assistant

## 2015-12-29 ENCOUNTER — Encounter (HOSPITAL_BASED_OUTPATIENT_CLINIC_OR_DEPARTMENT_OTHER): Payer: Self-pay | Admitting: *Deleted

## 2015-12-29 DIAGNOSIS — K2970 Gastritis, unspecified, without bleeding: Secondary | ICD-10-CM | POA: Diagnosis not present

## 2015-12-29 DIAGNOSIS — I10 Essential (primary) hypertension: Secondary | ICD-10-CM | POA: Diagnosis not present

## 2015-12-29 DIAGNOSIS — Z79899 Other long term (current) drug therapy: Secondary | ICD-10-CM | POA: Insufficient documentation

## 2015-12-29 DIAGNOSIS — R1013 Epigastric pain: Secondary | ICD-10-CM | POA: Diagnosis present

## 2015-12-29 LAB — COMPREHENSIVE METABOLIC PANEL
ALBUMIN: 3.8 g/dL (ref 3.5–5.0)
ALT: 20 U/L (ref 14–54)
AST: 31 U/L (ref 15–41)
Alkaline Phosphatase: 56 U/L (ref 38–126)
Anion gap: 6 (ref 5–15)
BUN: 18 mg/dL (ref 6–20)
CHLORIDE: 104 mmol/L (ref 101–111)
CO2: 28 mmol/L (ref 22–32)
Calcium: 9.2 mg/dL (ref 8.9–10.3)
Creatinine, Ser: 0.9 mg/dL (ref 0.44–1.00)
GFR calc Af Amer: >60 mL/min (ref >60)
GLUCOSE: 88 mg/dL (ref 65–99)
POTASSIUM: 3.8 mmol/L (ref 3.5–5.1)
Sodium: 138 mmol/L (ref 135–145)
Total Bilirubin: 0.9 mg/dL (ref 0.3–1.2)
Total Protein: 6.7 g/dL (ref 6.5–8.1)

## 2015-12-29 LAB — CBC WITH DIFFERENTIAL/PLATELET
BASOS PCT: 1 %
Basophils Absolute: 0 10*3/uL (ref 0.0–0.1)
Eosinophils Absolute: 0.2 10*3/uL (ref 0.0–0.7)
Eosinophils Relative: 4 %
HCT: 39.8 % (ref 36.0–46.0)
HEMOGLOBIN: 13.4 g/dL (ref 12.0–15.0)
Lymphocytes Relative: 43 %
Lymphs Abs: 1.6 10*3/uL (ref 0.7–4.0)
MCH: 29.3 pg (ref 26.0–34.0)
MCHC: 33.7 g/dL (ref 30.0–36.0)
MCV: 86.9 fL (ref 78.0–100.0)
MONOS PCT: 12 %
Monocytes Absolute: 0.5 10*3/uL (ref 0.1–1.0)
Neutro Abs: 1.6 10*3/uL — ABNORMAL LOW (ref 1.7–7.7)
Neutrophils Relative %: 40 %
Platelets: ADEQUATE 10*3/uL (ref 150–400)
RBC: 4.58 MIL/uL (ref 3.87–5.11)
RDW: 12.9 % (ref 11.5–15.5)
WBC: 3.9 10*3/uL — AB (ref 4.0–10.5)

## 2015-12-29 LAB — TROPONIN I

## 2015-12-29 MED ORDER — GI COCKTAIL ~~LOC~~
30.0000 mL | Freq: Once | ORAL | Status: AC
  Administered 2015-12-29: 30 mL via ORAL
  Filled 2015-12-29: qty 30

## 2015-12-29 MED ORDER — SUCRALFATE 1 GM/10ML PO SUSP: 1.0000 g | Freq: Three times a day (TID) | ORAL | 0 refills | Status: DC | PRN

## 2015-12-29 MED FILL — CARAFATE 1 GM/10 ML SUSP: 1 | 14 days supply | Qty: 420 | Fill #0

## 2015-12-29 NOTE — Discharge Instructions (Signed)
USe your omeprazole as faxed to your work. YOu can use mylanta or karafate to help with symptoms.

## 2015-12-29 NOTE — ED Notes (Signed)
MD at bedside. 

## 2015-12-29 NOTE — ED Provider Notes (Signed)
[facility]   CSN: [id number] Arrival date & time: 12/29/15  7829  First Provider Contact:  First MD Initiated Contact with Patient 12/29/15 [phone]        History   Chief Complaint Chief Complaint  Patient presents with  . Abdominal Pain    HPI Kynli Chou is a 60 y.o. female.  HPI   Patient is a 60 year old female with history of hypertension hyperlipidemia presenting with chest pain/epigastric pain since yesterday. Patient went to work came home and felt pain in her epigastrium. This started after eating a shrimp meal. Patient states she's not sure that she ever had this kind of pain before. Did not radiate anywhere. Was nonexertional. Also she was diaphoresis. Patient just has mild nausea with it.  Past Medical History:  Diagnosis Date  . Hypertension     Patient Active Problem List   Diagnosis Date Noted  . Right knee pain 04/15/2014    Past Surgical History:  Procedure Laterality Date  . CESAREAN SECTION      OB History    No data available       Home Medications    Prior to Admission medications   Medication Sig Start Date End Date Taking? Authorizing Provider  esomeprazole (NEXIUM) 20 MG capsule Take 20 mg by mouth daily at 12 noon.   Yes Historical Provider, MD  FeFum-FePoly-FA-B Cmp-C-Biot (FOLIVANE-PLUS) CAPS Take 1 capsule by mouth every morning.   Yes Historical Provider, MD  fluticasone (FLONASE) 50 MCG/ACT nasal spray Place into both nostrils daily.   Yes Historical Provider, MD  hydrochlorothiazide (HYDRODIURIL) 25 MG tablet Take 25 mg by mouth daily.    Yes Historical Provider, MD  meloxicam (MOBIC) 15 MG tablet Take 15 mg by mouth daily.   Yes Historical Provider, MD  potassium chloride SA (K-DUR,KLOR-CON) 20 MEQ tablet Take 20 mEq by mouth 2 (two) times daily.   Yes Historical Provider, MD  simvastatin (ZOCOR) 20 MG tablet Take 20 mg by mouth daily.   Yes Historical Provider, MD  valsartan (DIOVAN) 160 MG tablet Take  160 mg by mouth daily.   Yes Historical Provider, MD  ibuprofen (ADVIL,MOTRIN) 800 MG tablet Take 1 tablet (800 mg total) by mouth 3 (three) times daily. 04/04/14   Gilda Crease, MD  traMADol (ULTRAM) 50 MG tablet Take 1 tablet (50 mg total) by mouth every 6 (six) hours as needed. 04/04/14   Gilda Crease, MD    Family History History reviewed. No pertinent family history.  Social History Social History  Substance Use Topics  . Smoking status: Never Smoker  . Smokeless tobacco: Never Used  . Alcohol use No     Allergies   Review of patient's allergies indicates no known allergies.   Review of Systems Review of Systems  Constitutional: Negative for activity change.  Respiratory: Negative for shortness of breath.   Cardiovascular: Negative for chest pain.  Gastrointestinal: Positive for abdominal pain. Negative for diarrhea, nausea and vomiting.  All other systems reviewed and are negative.    Physical Exam Updated Vital Signs BP 131/81 (BP Location: Left Arm)   Pulse 76   Temp 98.2 F (36.8 C) (Oral)   Resp 18   Ht  (1.676 m)   Wt 140 lb (63.5 kg)   SpO2 100%   BMI 22.60 kg/m   Physical Exam  Constitutional: She appears well-developed and well-nourished. No distress.  HENT:  Head: Normocephalic and atraumatic.  Eyes: Conjunctivae are normal.  Neck: Neck supple.  Cardiovascular: Normal rate and regular rhythm.   No murmur heard. Pulmonary/Chest: Effort normal and breath sounds normal. No respiratory distress.  Abdominal: Soft. There is no tenderness.  Musculoskeletal: She exhibits no edema.  Neurological: She is alert.  Skin: Skin is warm and dry.  Psychiatric: She has a normal mood and affect.  Nursing note and vitals reviewed.    ED Treatments / Results  Labs (all labs ordered are listed, but only abnormal results are displayed) Labs Reviewed  CBC WITH DIFFERENTIAL/PLATELET - Abnormal; Notable for the following:       Result Value    WBC 3.9 (*)    Neutro Abs 1.6 (*)    All other components within normal limits  COMPREHENSIVE METABOLIC PANEL  TROPONIN I    EKG  EKG Interpretation  Date/Time:  Thursday December 29 2015 09:26:02 EDT Ventricular Rate:  64 PR Interval:    QRS Duration: 102 QT Interval:  409 QTC Calculation: 422 R Axis:   85 Text Interpretation:  Sinus rhythm RSR' in V1 or V2, right VCD or RVH No significant change since last tracing Confirmed by Kandis Mannan ([id number]) on 12/29/2015 10:07:44 AM       Radiology No results found.  Procedures Procedures (including critical care time)  Medications Ordered in ED Medications  gi cocktail (Maalox,Lidocaine,Donnatal) (30 mLs Oral Given 12/29/15 0939)     Initial Impression / Assessment and Plan / ED Course  I have reviewed the triage vital signs and the nursing notes.  Pertinent labs & imaging results that were available during my care of the patient were reviewed by me and considered in my medical decision making (see chart for details).  Clinical Course    Patient is a 60 year old female with hypertension hyperlipidemia presenting with epigastric pain. I think this is unlikely to be cardiac given that this been going on since last night was associated with food however we'll do initial troponin. Anticipate that being negative and we'll treat her for GERD symptoms/Gastritis.  10:43 AM Patient apparently had run out of her esompeprazole and her MD is faxing it to her work so she can refill it. Labs and EKG normal. I think this is gastric related, will have her follow up as outaptient.   Final Clinical Impressions(s) / ED Diagnoses   Final diagnoses:  None    New Prescriptions New Prescriptions   No medications on file     Braley Luckenbaugh Randall An, MD 12/29/15 1043

## 2015-12-29 NOTE — ED Triage Notes (Signed)
Epigastric pain since last night after eating.  Pt denies any other symptoms.  Pt states she has had this before with pain radiating to back.  Pt relates she has run out of nexium.

## 2015-12-29 NOTE — ED Triage Notes (Signed)
Pt reports epigastric pain x last night night, "achy". Pt states epigastric area is tender to touch, pain increases with movements and deep inspiration. Denies any n/v/d, pain does not radiate.

## 2016-07-05 ENCOUNTER — Other Ambulatory Visit: Payer: Self-pay | Admitting: Family Medicine

## 2016-07-05 DIAGNOSIS — Z1231 Encounter for screening mammogram for malignant neoplasm of breast: Secondary | ICD-10-CM

## 2016-07-06 ENCOUNTER — Ambulatory Visit
Admission: RE | Admit: 2016-07-06 | Discharge: 2016-07-06 | Disposition: A | Discharge status: Home / Self Care | Payer: 59 | Source: Ambulatory Visit | Attending: Family Medicine | Admitting: Family Medicine

## 2016-07-06 DIAGNOSIS — Z1231 Encounter for screening mammogram for malignant neoplasm of breast: Secondary | ICD-10-CM

## 2016-12-27 DIAGNOSIS — Z800 Family history of malignant neoplasm of digestive organs: Secondary | ICD-10-CM | POA: Insufficient documentation

## 2017-07-11 DIAGNOSIS — Z0000 Encounter for general adult medical examination without abnormal findings: Secondary | ICD-10-CM | POA: Diagnosis not present

## 2017-07-11 DIAGNOSIS — E782 Mixed hyperlipidemia: Secondary | ICD-10-CM | POA: Diagnosis not present

## 2017-07-11 DIAGNOSIS — K219 Gastro-esophageal reflux disease without esophagitis: Secondary | ICD-10-CM | POA: Diagnosis not present

## 2017-07-11 DIAGNOSIS — I10 Essential (primary) hypertension: Secondary | ICD-10-CM | POA: Diagnosis not present

## 2017-08-12 ENCOUNTER — Other Ambulatory Visit: Payer: Self-pay | Admitting: Family Medicine

## 2017-08-12 DIAGNOSIS — Z1231 Encounter for screening mammogram for malignant neoplasm of breast: Secondary | ICD-10-CM

## 2017-08-28 ENCOUNTER — Ambulatory Visit: Payer: 59

## 2017-08-28 ENCOUNTER — Ambulatory Visit
Admission: RE | Admit: 2017-08-28 | Discharge: 2017-08-28 | Disposition: A | Discharge status: Home / Self Care | Payer: 59 | Source: Ambulatory Visit | Attending: Family Medicine | Admitting: Family Medicine

## 2017-08-28 DIAGNOSIS — Z1231 Encounter for screening mammogram for malignant neoplasm of breast: Secondary | ICD-10-CM

## 2017-09-03 DIAGNOSIS — I679 Cerebrovascular disease, unspecified: Secondary | ICD-10-CM | POA: Diagnosis not present

## 2017-09-03 DIAGNOSIS — E049 Nontoxic goiter, unspecified: Secondary | ICD-10-CM | POA: Diagnosis not present

## 2017-09-03 DIAGNOSIS — Z09 Encounter for follow-up examination after completed treatment for conditions other than malignant neoplasm: Secondary | ICD-10-CM | POA: Diagnosis not present

## 2017-09-03 DIAGNOSIS — Z862 Personal history of diseases of the blood and blood-forming organs and certain disorders involving the immune mechanism: Secondary | ICD-10-CM | POA: Diagnosis not present

## 2017-09-10 DIAGNOSIS — E042 Nontoxic multinodular goiter: Secondary | ICD-10-CM | POA: Diagnosis not present

## 2017-09-10 DIAGNOSIS — R590 Localized enlarged lymph nodes: Secondary | ICD-10-CM | POA: Diagnosis not present

## 2017-09-16 DIAGNOSIS — D708 Other neutropenia: Secondary | ICD-10-CM | POA: Diagnosis not present

## 2017-09-16 DIAGNOSIS — R590 Localized enlarged lymph nodes: Secondary | ICD-10-CM | POA: Diagnosis not present

## 2017-09-16 DIAGNOSIS — D704 Cyclic neutropenia: Secondary | ICD-10-CM | POA: Diagnosis not present

## 2017-09-16 DIAGNOSIS — E042 Nontoxic multinodular goiter: Secondary | ICD-10-CM | POA: Diagnosis not present

## 2017-10-08 DIAGNOSIS — R590 Localized enlarged lymph nodes: Secondary | ICD-10-CM | POA: Diagnosis not present

## 2017-10-08 DIAGNOSIS — K1120 Sialoadenitis, unspecified: Secondary | ICD-10-CM | POA: Diagnosis not present

## 2017-10-12 ENCOUNTER — Encounter (HOSPITAL_BASED_OUTPATIENT_CLINIC_OR_DEPARTMENT_OTHER): Payer: Self-pay | Admitting: *Deleted

## 2017-10-12 ENCOUNTER — Emergency Department (HOSPITAL_BASED_OUTPATIENT_CLINIC_OR_DEPARTMENT_OTHER): Payer: 59

## 2017-10-12 ENCOUNTER — Emergency Department (HOSPITAL_BASED_OUTPATIENT_CLINIC_OR_DEPARTMENT_OTHER)
Admission: EM | Admit: 2017-10-12 | Discharge: 2017-10-12 | Disposition: A | Discharge status: Home / Self Care | Payer: 59 | Attending: Emergency Medicine | Admitting: Emergency Medicine

## 2017-10-12 ENCOUNTER — Other Ambulatory Visit: Payer: Self-pay

## 2017-10-12 DIAGNOSIS — M25561 Pain in right knee: Secondary | ICD-10-CM | POA: Diagnosis not present

## 2017-10-12 DIAGNOSIS — I10 Essential (primary) hypertension: Secondary | ICD-10-CM | POA: Diagnosis not present

## 2017-10-12 DIAGNOSIS — Z79899 Other long term (current) drug therapy: Secondary | ICD-10-CM | POA: Diagnosis not present

## 2017-10-12 DIAGNOSIS — M79604 Pain in right leg: Secondary | ICD-10-CM | POA: Diagnosis present

## 2017-10-12 MED ORDER — HYDROCODONE-ACETAMINOPHEN 5-325 MG PO TABS
1.0000 | ORAL_TABLET | Freq: Once | ORAL | Status: AC
  Administered 2017-10-12: 1 via ORAL
  Filled 2017-10-12: qty 1

## 2017-10-12 NOTE — ED Triage Notes (Signed)
Pt reports pain in right leg while working today. Denies injury. Ambulatory to triage with limp

## 2017-10-13 NOTE — ED Provider Notes (Signed)
[facility] EMERGENCY DEPARTMENT Provider Note   CSN: [id number] Arrival date & time: 10/12/17  1948     History   Chief Complaint Chief Complaint  Patient presents with  . Leg Pain    HPI Alexa Hernandez is a 62 y.o. female.  The history is provided by the patient. No language interpreter was used.  Leg Pain   This is a new problem. The current episode started 12 to 24 hours ago. The problem occurs constantly. The problem has been gradually worsening. The pain is present in the right knee. The quality of the pain is described as aching. The pain is at a severity of 5/10. The pain is moderate. The symptoms are aggravated by activity. She has tried nothing for the symptoms. The treatment provided no relief.  Pt reports her knee began hurting at work.  Pt denies any injury  Past Medical History:  Diagnosis Date  . Hypertension     Patient Active Problem List   Diagnosis Date Noted  . Right knee pain 04/15/2014    Past Surgical History:  Procedure Laterality Date  . CESAREAN SECTION       OB History   None      Home Medications    Prior to Admission medications   Medication Sig Start Date End Date Taking? Authorizing Provider  FeFum-FePoly-FA-B Cmp-C-Biot (FOLIVANE-PLUS) CAPS Take 1 capsule by mouth every morning.   Yes [provider]  hydrochlorothiazide (HYDRODIURIL) 25 MG tablet Take 25 mg by mouth daily.    Yes [provider]  meloxicam (MOBIC) 15 MG tablet Take 15 mg by mouth daily.   Yes [provider]  omeprazole (PRILOSEC) 40 MG capsule Take 40 mg by mouth daily.   Yes [provider]  potassium chloride SA (K-DUR,KLOR-CON) 20 MEQ tablet Take 20 mEq by mouth 2 (two) times daily.   Yes [provider]  simvastatin (ZOCOR) 20 MG tablet Take 20 mg by mouth daily.   Yes [provider]    Family History No family history on file.  Social History Social History   Tobacco Use  . Smoking  status: Never Smoker  . Smokeless tobacco: Never Used  Substance Use Topics  . Alcohol use: No    Alcohol/week: 0.0 oz  . Drug use: No     Allergies   Lisinopril   Review of Systems Review of Systems  All other systems reviewed and are negative.    Physical Exam Updated Vital Signs BP (!) 149/85 (BP Location: Right Arm)   Pulse 65   Temp 97.8 F (36.6 C) (Oral)   Resp 16   Ht  (1.676 m)   Wt 68 kg (150 lb)   SpO2 99%   BMI 24.21 kg/m   Physical Exam  Constitutional: She is oriented to person, place, and time. She appears well-developed and well-nourished.  HENT:  Head: Normocephalic.  Eyes: Pupils are equal, round, and reactive to light.  Neck: Normal range of motion.  Cardiovascular: Normal rate.  Pulmonary/Chest: Effort normal.  Musculoskeletal: Normal range of motion.  Tender medial knee  No instability,  Negative Lachman and drawer.   Neurological: She is alert and oriented to person, place, and time.  Skin: Skin is warm.  Psychiatric: She has a normal mood and affect.  Nursing note and vitals reviewed.    ED Treatments / Results  Labs (all labs ordered are listed, but only abnormal results are displayed) Labs Reviewed - No data to display  EKG None  Radiology Dg Knee Complete 4 Views Right  Result Date: 10/12/2017 CLINICAL DATA:  Knee pain.  No injury EXAM: RIGHT KNEE - COMPLETE 4+ VIEW COMPARISON:  None. FINDINGS: Mild degenerative changes, most pronounced in the patellofemoral compartment with joint space narrowing and spurring. No acute bony abnormality. Specifically, no fracture, subluxation, or dislocation. IMPRESSION: Mild-to-moderate degenerative changes, most pronounced in the patellofemoral compartment. No acute bony abnormality. Electronically Signed   By: Charlett Nose M.D.   On: 10/12/2017 22:56    Procedures Procedures (including critical care time)  Medications Ordered in ED Medications  HYDROcodone-acetaminophen  (NORCO/VICODIN) 5-325 MG per tablet 1 tablet (1 tablet Oral Given 10/12/17 2346)     Initial Impression / Assessment and Plan / ED Course  I have reviewed the triage vital signs and the nursing notes.  Pertinent labs & imaging results that were available during my care of the patient were reviewed by me and considered in my medical decision making (see chart for details).    Pt placed in a knee immbolizer.  Pt advised to follow up with Dr. Pearletha Forge for recheck    Final Clinical Impressions(s) / ED Diagnoses   Final diagnoses:  Acute pain of right knee    ED Discharge Orders    None    An After Visit Summary was printed and given to the patient.    Elson Areas, [place] 10/13/17 1541    Tilden Fossa, MD 10/13/17 [phone]

## 2017-10-15 ENCOUNTER — Encounter: Payer: Self-pay | Admitting: Family Medicine

## 2017-10-15 ENCOUNTER — Ambulatory Visit (INDEPENDENT_AMBULATORY_CARE_PROVIDER_SITE_OTHER): Payer: 59 | Admitting: Family Medicine

## 2017-10-15 DIAGNOSIS — M25561 Pain in right knee: Secondary | ICD-10-CM

## 2017-10-15 NOTE — Patient Instructions (Signed)
Your pain is due to arthritis. These are the different medications you can take for this: Tylenol  1-2 tabs three times a day for pain. Capsaicin, aspercreme, or biofreeze topically up to four times a day may also help with pain. Some supplements that may help for arthritis: Boswellia extract, curcumin, pycnogenol Continue your meloxicam. Cortisone injections are an option if this recurs. If cortisone injections do not help, there are different types of shots that may help but they take longer to take effect. It's important that you continue to stay active. Straight leg raises, knee extensions 3 sets of 10 once a day (add ankle weight if these become too easy). Consider physical therapy to strengthen muscles around the joint that hurts to take pressure off of the joint itself. Shoe inserts with good arch support may be helpful. Heat or ice 15 minutes at a time 3-4 times a day as needed to help with pain. Water aerobics and cycling with low resistance are the best two types of exercise for arthritis though any exercise is ok as long as it doesn't worsen the pain. Follow up with me as needed.

## 2017-10-15 NOTE — Progress Notes (Signed)
PCP: [facility]  Subjective:   HPI: Patient is a 62 y.o. female here for a 4 day history of right knee pain. She first noticed the pain at work while walking. She cannot identify any trigger for the onset of the pain. The pain was located at the medial aspect of the knee. It was a sharp pain when she first noticed it, but it transitioned to a dull pain that lasted about 3 hours. It is worse when walking up stairs and improves with rest. She takes meloxicam daily for joint pain in her hands, but otherwise has not tried anything for the knee. She reports that it feels normal today and rates the pain as 0/10 in severity at rest and with walking. She reports that when if first came on, there was some swelling that gradually resolved. She visited the ED on the day that she had the pain and was provided a knee immobilizer that she wore for a few hours, but removed once the pain resolved. She was also given a pain medication that she only took one time. She denies numbness and tingling. She denies locking or catching of the knee. She denies previous injury. She endorses having a similar pain a few years ago that resolved after an injection.   ROS: see HPI  Past Medical History:  Diagnosis Date  . Hypertension     Current Outpatient Medications on File Prior to Visit  Medication Sig Dispense Refill  . FeFum-FePoly-FA-B Cmp-C-Biot (FOLIVANE-PLUS) CAPS Take 1 capsule by mouth every morning.    . hydrochlorothiazide (HYDRODIURIL) 25 MG tablet Take 25 mg by mouth daily.     . meloxicam (MOBIC) 15 MG tablet Take 15 mg by mouth daily.    Marland Kitchen omeprazole (PRILOSEC) 40 MG capsule Take 40 mg by mouth daily.    . potassium chloride SA (K-DUR,KLOR-CON) 20 MEQ tablet Take 20 mEq by mouth 2 (two) times daily.    . simvastatin (ZOCOR) 20 MG tablet Take 20 mg by mouth daily.     No current facility-administered medications on file prior to visit.     Past Surgical History:  Procedure Laterality  Date  . CESAREAN SECTION      Allergies  Allergen Reactions  . Lisinopril Cough    Social History   Socioeconomic History  . Marital status: Married    Spouse name: Not on file  . Number of children: Not on file  . Years of education: Not on file  . Highest education level: Not on file  Occupational History  . Not on file  Social Needs  . Financial resource strain: Not on file  . Food insecurity:    Worry: Not on file    Inability: Not on file  . Transportation needs:    Medical: Not on file    Non-medical: Not on file  Tobacco Use  . Smoking status: Never Smoker  . Smokeless tobacco: Never Used  Substance and Sexual Activity  . Alcohol use: No    Alcohol/week: 0.0 oz  . Drug use: No  . Sexual activity: Not on file  Lifestyle  . Physical activity:    Days per week: Not on file    Minutes per session: Not on file  . Stress: Not on file  Relationships  . Social connections:    Talks on phone: Not on file    Gets together: Not on file    Attends religious service: Not on file    Active member of  club or organization: Not on file    Attends meetings of clubs or organizations: Not on file    Relationship status: Not on file  . Intimate partner violence:    Fear of current or ex partner: Not on file    Emotionally abused: Not on file    Physically abused: Not on file    Forced sexual activity: Not on file  Other Topics Concern  . Not on file  Social History Narrative  . Not on file    No family history on file.  BP 135/80   Pulse 83   Ht  (1.676 m)   Wt 149 lb (67.6 kg)   BMI 24.05 kg/m        Objective:  Physical Exam:  Gen: NAD, comfortable in exam room CV: warm, well-perfused Pulm: non-labored breathing  Right knee: No deformity. No swelling or skin changes. Mild TTP at medial joint line. No TTP at lateral joint line, patella, patellar tendon, or quad tendon. Full ROM with flexion/extension. 5/5 strength with flexion/extension. No  ligamentous laxity with anterior/posterior drawer or varus/valgus stress. Negative Lachman's. Negative McMurray's and Thessaly's. NVI distally.  Left knee: No deformity. FROM with 5/5 strength. No tenderness to palpation. NVI distally.    Assessment & Plan:  1. Right knee pain: Patient presents following a 3 hour episode of knee pain that occurred 4 days prior to presentation. She has mild medial joint line tenderness, but overall her exam is reassuring. Reviewed radiographs from 5/18 without signs of acute abnormality. Pain is most likely secondary to mild-to-moderate osteoarthritis of the right knee. Given that her pain has resolved spontaneously, do not recommend further workup or invasive management at this time. Advised patient to continue taking meloxicam as prescribed. She may try OTC topical medications such as capsaicin cream. We provided a list of home exercises to perform daily. Will follow-up as needed if symptoms worsen or fail to improve.   Carron Curie, MS4

## 2017-10-16 ENCOUNTER — Encounter: Payer: Self-pay | Admitting: Family Medicine

## 2017-10-16 DIAGNOSIS — I10 Essential (primary) hypertension: Secondary | ICD-10-CM | POA: Insufficient documentation

## 2017-10-16 DIAGNOSIS — E782 Mixed hyperlipidemia: Secondary | ICD-10-CM | POA: Insufficient documentation

## 2017-10-16 DIAGNOSIS — I679 Cerebrovascular disease, unspecified: Secondary | ICD-10-CM | POA: Insufficient documentation

## 2017-10-16 NOTE — Assessment & Plan Note (Signed)
Patient presents following a 3 hour episode of knee pain that occurred 4 days prior to presentation. She has mild medial joint line tenderness, but overall her exam is reassuring. Reviewed radiographs from 5/18 without signs of acute abnormality. Pain is most likely secondary to mild-to-moderate osteoarthritis of the right knee. Given that her pain has resolved spontaneously, do not recommend further workup or invasive management at this time. Advised patient to continue taking meloxicam as prescribed. She may try OTC topical medications such as capsaicin cream. We provided a list of home exercises to perform daily. Will follow-up as needed if symptoms worsen or fail to improve.

## 2017-11-06 DIAGNOSIS — H401131 Primary open-angle glaucoma, bilateral, mild stage: Secondary | ICD-10-CM | POA: Diagnosis not present

## 2017-11-17 DIAGNOSIS — N023 Recurrent and persistent hematuria with diffuse mesangial proliferative glomerulonephritis: Secondary | ICD-10-CM | POA: Diagnosis not present

## 2017-12-11 DIAGNOSIS — E042 Nontoxic multinodular goiter: Secondary | ICD-10-CM | POA: Diagnosis not present

## 2017-12-11 DIAGNOSIS — I10 Essential (primary) hypertension: Secondary | ICD-10-CM | POA: Diagnosis not present

## 2017-12-11 DIAGNOSIS — K219 Gastro-esophageal reflux disease without esophagitis: Secondary | ICD-10-CM | POA: Diagnosis not present

## 2017-12-26 DIAGNOSIS — Z124 Encounter for screening for malignant neoplasm of cervix: Secondary | ICD-10-CM | POA: Diagnosis not present

## 2018-02-28 DIAGNOSIS — D704 Cyclic neutropenia: Secondary | ICD-10-CM | POA: Diagnosis not present

## 2018-02-28 DIAGNOSIS — D708 Other neutropenia: Secondary | ICD-10-CM | POA: Diagnosis not present

## 2018-05-01 DIAGNOSIS — R591 Generalized enlarged lymph nodes: Secondary | ICD-10-CM | POA: Diagnosis not present

## 2018-05-01 DIAGNOSIS — E042 Nontoxic multinodular goiter: Secondary | ICD-10-CM | POA: Diagnosis not present

## 2018-05-14 DIAGNOSIS — K1120 Sialoadenitis, unspecified: Secondary | ICD-10-CM | POA: Diagnosis not present

## 2018-05-30 DIAGNOSIS — Z0000 Encounter for general adult medical examination without abnormal findings: Secondary | ICD-10-CM | POA: Diagnosis not present

## 2018-05-30 DIAGNOSIS — Z79899 Other long term (current) drug therapy: Secondary | ICD-10-CM | POA: Diagnosis not present

## 2018-05-30 DIAGNOSIS — E782 Mixed hyperlipidemia: Secondary | ICD-10-CM | POA: Diagnosis not present

## 2018-05-30 DIAGNOSIS — K219 Gastro-esophageal reflux disease without esophagitis: Secondary | ICD-10-CM | POA: Diagnosis not present

## 2018-05-30 DIAGNOSIS — I10 Essential (primary) hypertension: Secondary | ICD-10-CM | POA: Diagnosis not present

## 2018-05-30 DIAGNOSIS — R51 Headache: Secondary | ICD-10-CM | POA: Diagnosis not present

## 2018-06-03 DIAGNOSIS — Z780 Asymptomatic menopausal state: Secondary | ICD-10-CM | POA: Diagnosis not present

## 2018-06-03 DIAGNOSIS — Z13820 Encounter for screening for osteoporosis: Secondary | ICD-10-CM | POA: Diagnosis not present

## 2018-07-07 DIAGNOSIS — H401131 Primary open-angle glaucoma, bilateral, mild stage: Secondary | ICD-10-CM | POA: Diagnosis not present

## 2018-08-03 DIAGNOSIS — M1712 Unilateral primary osteoarthritis, left knee: Secondary | ICD-10-CM | POA: Diagnosis not present

## 2018-08-03 DIAGNOSIS — M25562 Pain in left knee: Secondary | ICD-10-CM | POA: Diagnosis not present

## 2018-08-11 ENCOUNTER — Encounter: Payer: Self-pay | Admitting: Family Medicine

## 2018-08-11 ENCOUNTER — Other Ambulatory Visit: Payer: Self-pay

## 2018-08-11 ENCOUNTER — Ambulatory Visit (INDEPENDENT_AMBULATORY_CARE_PROVIDER_SITE_OTHER): Payer: 59 | Admitting: Family Medicine

## 2018-08-11 VITALS — BP 125/77 | HR 86 | Ht 66.0 in | Wt 142.0 lb

## 2018-08-11 DIAGNOSIS — M25562 Pain in left knee: Secondary | ICD-10-CM

## 2018-08-11 MED ORDER — METHYLPREDNISOLONE ACETATE 40 MG/ML IJ SUSP
40.0000 mg | Freq: Once | INTRAMUSCULAR | Status: AC
  Administered 2018-08-11: 40 mg via INTRA_ARTICULAR

## 2018-08-11 NOTE — Progress Notes (Signed)
PCP: [facility]  Subjective:   HPI: Patient is a 63 y.o. female here for left knee pain.  Patient reports about a week ago she developed worsening pain and a lot of swelling in left knee. 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 Had to leave work due to the amount of swelling and pain. Pain up to 4-5/10 and sharp, worse with weight bearing. Has been compressing and taking meloxicam with mild improvement. No skin changes, numbness.  Past Medical History:  Diagnosis Date  . Hypertension     Current Outpatient Medications on File Prior to Visit  Medication Sig Dispense Refill  . aspirin EC 81 MG tablet Take by mouth.    . FeFum-FePoly-FA-B Cmp-C-Biot (FOLIVANE-PLUS) CAPS Take 1 capsule by mouth every morning.    . fexofenadine (ALLEGRA) 60 MG tablet Take by mouth.    . fluticasone (FLONASE) 50 MCG/ACT nasal spray Place into the nose.    . hydrochlorothiazide (HYDRODIURIL) 25 MG tablet Take 25 mg by mouth daily.     Marland Kitchen latanoprost (XALATAN) 0.005 % ophthalmic solution INT 1 GTT AEY ONCE D IN THE EVE  4  . lisinopril-hydrochlorothiazide (PRINZIDE,ZESTORETIC) 20-12.5 MG tablet Take by mouth.    . magnesium oxide (MAG-OX) 400 MG tablet Take by mouth.    . meloxicam (MOBIC) 15 MG tablet Take 15 mg by mouth daily.    Marland Kitchen omeprazole (PRILOSEC) 40 MG capsule Take 40 mg by mouth daily.    . potassium chloride SA (K-DUR,KLOR-CON) 20 MEQ tablet Take 20 mEq by mouth 2 (two) times daily.    . simvastatin (ZOCOR) 20 MG tablet Take 20 mg by mouth daily.     No current facility-administered medications on file prior to visit.     Past Surgical History:  Procedure Laterality Date  . CESAREAN SECTION      Allergies  Allergen Reactions  . Lisinopril Cough    Social History   Socioeconomic History  . Marital status: Married    Spouse name: Not on file  . Number of children: Not on file  . Years of education: Not on file  . Highest education level:  Not on file  Occupational History  . Not on file  Social Needs  . Financial resource strain: Not on file  . Food insecurity:    Worry: Not on file    Inability: Not on file  . Transportation needs:    Medical: Not on file    Non-medical: Not on file  Tobacco Use  . Smoking status: Never Smoker  . Smokeless tobacco: Never Used  Substance and Sexual Activity  . Alcohol use: No    Alcohol/week: 0.0 standard drinks  . Drug use: No  . Sexual activity: Not on file  Lifestyle  . Physical activity:    Days per week: Not on file    Minutes per session: Not on file  . Stress: Not on file  Relationships  . Social connections:    Talks on phone: Not on file    Gets together: Not on file    Attends religious service: Not on file    Active member of club or organization: Not on file    Attends meetings of clubs or organizations: Not on file    Relationship status: Not on file  . Intimate partner violence:    Fear of current or ex partner: Not on file    Emotionally abused: Not on file    Physically  abused: Not on file    Forced sexual activity: Not on file  Other Topics Concern  . Not on file  Social History Narrative  . Not on file    History reviewed. No pertinent family history.  BP 125/77   Pulse 86   Ht 5\' 6"  (1.676 m)   Wt 142 lb (64.4 kg)   BMI 22.92 kg/m   Review of Systems: See HPI above.     Objective:  Physical Exam:  Gen: NAD, comfortable in exam room  Left knee: Mild effusion.  No other gross deformity, ecchymoses. Mild TTP medial joint line. FROM with 5/5 strength flexion and extension. Negative ant/post drawers. Negative valgus/varus testing. Negative lachmans. Negative mcmurrays, apleys, patellar apprehension. NV intact distally.  Right knee: No deformity. FROM with 5/5 strength. No tenderness to palpation. NVI distally.   Assessment & Plan:  1. Left knee pain - 2/2 arthritis and synovitis.  Discussed options - went ahead with steroid  injection today.  Meloxicam, icing, compression sleeve, elevation.  Shown home exercises to do daily.  F/u in 1 month.  After informed written consent timeout was performed, patient was lying supine on exam table. Left knee was prepped with alcohol swab and utilizing superolateral approach with ultrasound guidance, patient's left knee was injected intraarticularly with 3:1 bupivicaine: depomedrol. Patient tolerated the procedure well without immediate complications.

## 2018-08-11 NOTE — Patient Instructions (Signed)
Your pain is due to arthritis with synovitis. These are the different medications you can take for this: Tylenol 500mg  1-2 tabs three times a day for pain. Capsaicin, aspercreme, or biofreeze topically up to four times a day may also help with pain. Some supplements that may help for arthritis: Boswellia extract, curcumin, pycnogenol Meloxicam daily with food Cortisone injections are an option - you were given this today. It's important that you continue to stay active. Straight leg raises, knee extensions 3 sets of 10 once a day (add ankle weight if these become too easy). Consider physical therapy to strengthen muscles around the joint that hurts to take pressure off of the joint itself. Shoe inserts with good arch support may be helpful. Ice 15 minutes at a time 3-4 times a day as needed to help with pain. Compression sleeve during the day, at work. Water aerobics and cycling with low resistance are the best two types of exercise for arthritis though any exercise is ok as long as it doesn't worsen the pain. Follow up with me in 1 month.

## 2018-08-14 ENCOUNTER — Other Ambulatory Visit: Payer: Self-pay | Admitting: Internal Medicine

## 2018-08-14 DIAGNOSIS — Z1231 Encounter for screening mammogram for malignant neoplasm of breast: Secondary | ICD-10-CM

## 2018-09-10 ENCOUNTER — Ambulatory Visit (INDEPENDENT_AMBULATORY_CARE_PROVIDER_SITE_OTHER): Payer: 59 | Admitting: Family Medicine

## 2018-09-10 ENCOUNTER — Other Ambulatory Visit: Payer: Self-pay

## 2018-09-10 ENCOUNTER — Encounter: Payer: Self-pay | Admitting: Family Medicine

## 2018-09-10 VITALS — BP 162/71 | HR 81 | Temp 97.8°F | Ht 64.0 in | Wt 150.0 lb

## 2018-09-10 DIAGNOSIS — M25562 Pain in left knee: Secondary | ICD-10-CM | POA: Diagnosis not present

## 2018-09-10 NOTE — Patient Instructions (Signed)
You're doing great! Do home exercises still 3 sets of 10 once a day (knee extensions and straight leg raises). Tylenol, aleve only if needed. Call me if you have any problems otherwise follow up as needed.

## 2018-09-10 NOTE — Progress Notes (Signed)
[facility]  Subjective:   HPI: Patient is a 63 y.o. female here for left knee pain.  3/16: Patient reports about a week ago she developed worsening pain and a lot of swelling in left knee. 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 Had to leave work due to the amount of swelling and pain. Pain up to 4-5/10 and sharp, worse with weight bearing. Has been compressing and taking meloxicam with mild improvement. No skin changes, numbness.  4/15: Patient reports she's doing very well. Rarely will take tylenol if needed. Wearing sleeve. Walking a lot can bother her some. Pain currently at 0/10 level. Shot took about a day to kick in and has helped since. No skin changes.  Past Medical History:  Diagnosis Date  . Hypertension     Current Outpatient Medications on File Prior to Visit  Medication Sig Dispense Refill  . aspirin EC 81 MG tablet Take by mouth.    . FeFum-FePoly-FA-B Cmp-C-Biot (FOLIVANE-PLUS) CAPS Take 1 capsule by mouth every morning.    . fexofenadine (ALLEGRA) 60 MG tablet Take by mouth.    . fluticasone (FLONASE) 50 MCG/ACT nasal spray Place into the nose.    . hydrochlorothiazide (HYDRODIURIL) 25 MG tablet Take 25 mg by mouth daily.     Marland Kitchen. latanoprost (XALATAN) 0.005 % ophthalmic solution INT 1 GTT AEY ONCE D IN THE EVE  4  . lisinopril-hydrochlorothiazide (PRINZIDE,ZESTORETIC) 20-12.5 MG tablet Take by mouth.    . magnesium oxide (MAG-OX) 400 MG tablet Take by mouth.    . meloxicam (MOBIC) 15 MG tablet Take 15 mg by mouth daily.    Marland Kitchen. omeprazole (PRILOSEC) 40 MG capsule Take 40 mg by mouth daily.    . potassium chloride SA (K-DUR,KLOR-CON) 20 MEQ tablet Take 20 mEq by mouth 2 (two) times daily.    . simvastatin (ZOCOR) 20 MG tablet Take 20 mg by mouth daily.     No current facility-administered medications on file prior to visit.     Past Surgical History:  Procedure Laterality Date  . CESAREAN SECTION       Allergies  Allergen Reactions  . Lisinopril Cough    Social History   Socioeconomic History  . Marital status: Married    Spouse name: Not on file  . Number of children: Not on file  . Years of education: Not on file  . Highest education level: Not on file  Occupational History  . Not on file  Social Needs  . Financial resource strain: Not on file  . Food insecurity:    Worry: Not on file    Inability: Not on file  . Transportation needs:    Medical: Not on file    Non-medical: Not on file  Tobacco Use  . Smoking status: Never Smoker  . Smokeless tobacco: Never Used  Substance and Sexual Activity  . Alcohol use: No    Alcohol/week: 0.0 standard drinks  . Drug use: No  . Sexual activity: Not on file  Lifestyle  . Physical activity:    Days per week: Not on file    Minutes per session: Not on file  . Stress: Not on file  Relationships  . Social connections:    Talks on phone: Not on file    Gets together: Not on file    Attends religious service: Not on file    Active member of club or organization: Not on file    Attends meetings  of clubs or organizations: Not on file    Relationship status: Not on file  . Intimate partner violence:    Fear of current or ex partner: Not on file    Emotionally abused: Not on file    Physically abused: Not on file    Forced sexual activity: Not on file  Other Topics Concern  . Not on file  Social History Narrative  . Not on file    History reviewed. No pertinent family history.  BP (!) 162/71   Pulse 81   Temp 97.8 F (36.6 C) (Oral)   Ht 5\' 4"  (1.626 m)   Wt 150 lb (68 kg)   BMI 25.75 kg/m   Review of Systems: See HPI above.     Objective:  Physical Exam:  Gen: NAD, comfortable in exam room  Left knee: No gross deformity, ecchymoses, swelling. No TTP. FROM with 5/5 strength flexion and extension. Negative ant/post drawers. Negative valgus/varus testing. Negative lachmans. Negative mcmurrays,  apleys. NV intact distally.  Assessment & Plan:  1. Left knee pain - 2/2 arthritis and synovitis.  Significantly improved following injection.  Tylenol or aleve if needed.  Icing if needed.  Compression sleeve.  Encouraged home exercises.  F/u prn.

## 2018-09-24 ENCOUNTER — Ambulatory Visit: Payer: 59

## 2018-11-21 ENCOUNTER — Ambulatory Visit
Admission: RE | Admit: 2018-11-21 | Discharge: 2018-11-21 | Disposition: A | Discharge status: Home / Self Care | Payer: 59 | Source: Ambulatory Visit | Attending: Internal Medicine | Admitting: Internal Medicine

## 2018-11-21 DIAGNOSIS — Z1231 Encounter for screening mammogram for malignant neoplasm of breast: Secondary | ICD-10-CM

## 2018-12-24 ENCOUNTER — Other Ambulatory Visit: Payer: Self-pay

## 2018-12-24 ENCOUNTER — Encounter (HOSPITAL_BASED_OUTPATIENT_CLINIC_OR_DEPARTMENT_OTHER): Payer: Self-pay

## 2018-12-24 ENCOUNTER — Emergency Department (HOSPITAL_BASED_OUTPATIENT_CLINIC_OR_DEPARTMENT_OTHER)
Admission: EM | Admit: 2018-12-24 | Discharge: 2018-12-24 | Disposition: A | Discharge status: Home / Self Care | Payer: 59 | Attending: Emergency Medicine | Admitting: Emergency Medicine

## 2018-12-24 DIAGNOSIS — Z79899 Other long term (current) drug therapy: Secondary | ICD-10-CM | POA: Diagnosis not present

## 2018-12-24 DIAGNOSIS — R1310 Dysphagia, unspecified: Secondary | ICD-10-CM

## 2018-12-24 DIAGNOSIS — I10 Essential (primary) hypertension: Secondary | ICD-10-CM | POA: Diagnosis not present

## 2018-12-24 DIAGNOSIS — R0600 Dyspnea, unspecified: Secondary | ICD-10-CM | POA: Diagnosis not present

## 2018-12-24 DIAGNOSIS — Z7982 Long term (current) use of aspirin: Secondary | ICD-10-CM | POA: Diagnosis not present

## 2018-12-24 DIAGNOSIS — E049 Nontoxic goiter, unspecified: Secondary | ICD-10-CM | POA: Insufficient documentation

## 2018-12-24 NOTE — Discharge Instructions (Signed)
Follow-up with your primary care doctor as previously scheduled.  Return emergency department for any trouble breathing, worsening swelling of your neck, difficulty swallowing her saliva or any liquids, chest pain, swelling of your tongue or lips or any other worsening or concerning symptoms.

## 2018-12-24 NOTE — ED Provider Notes (Signed)
[facility] Provider Note   CSN: [id number] Arrival date & time: 12/24/18  1651    History   Chief Complaint Chief Complaint  Patient presents with  . hard to swallow    HPI Alexa Hernandez is a 63 y.o. female past medical history of hypertension, goiter who presents for evaluation of difficulty swallowing.  She reports that she has had a goiter and swelling of her thyroid for approximately a year.  She states that it is not gotten worse and is stayed stable based on follow-up with her doctor.  She reports that while at work today, she felt like she was having trouble swallowing.  She states that she was able to swallow did not have any vomiting but just felt more uncomfortable.  She states that this caused her to get very concerned and she felt like maybe she had some trouble breathing.  She states that the swelling has not gotten worse or bigger but states she is feels it is more noticeable.  She states she is not having any sore throat.  She is able swallow her secretions without any difficulty.  On ED arrival, she states symptoms have improved.  She has an appointment with her thyroid doctor tomorrow for evaluation of this year long history of enlarged thyroid.  Patient denies any swelling of her tongue or lips, fevers, vomiting.     The history is provided by the patient.    Past Medical History:  Diagnosis Date  . Hypertension     Patient Active Problem List   Diagnosis Date Noted  . Mixed hyperlipidemia 10/16/2017  . HTN (hypertension) 10/16/2017  . Small vessel disease, cerebrovascular 10/16/2017  . Family history of colon cancer 12/27/2016  . 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 Multinodular goiter 08/07/2015  . GERD (gastroesophageal reflux disease) 06/24/2015  . Right knee pain 04/15/2014  . Left ear hearing loss 02/26/2012    Past Surgical History:  Procedure Laterality Date  . CESAREAN SECTION       OB History   No  obstetric history on file.      Home Medications    Prior to Admission medications   Medication Sig Start Date End Date Taking? Authorizing Provider  aspirin EC 81 MG tablet Take by mouth.    [provider]  FeFum-FePoly-FA-B Cmp-C-Biot (FOLIVANE-PLUS) CAPS Take 1 capsule by mouth every morning.    [provider]  fexofenadine (ALLEGRA) 60 MG tablet Take by mouth. 07/11/17   [provider]  fluticasone (FLONASE) 50 MCG/ACT nasal spray Place into the nose. 07/11/17   [provider]  hydrochlorothiazide (HYDRODIURIL) 25 MG tablet Take 25 mg by mouth daily.     [provider]  latanoprost (XALATAN) 0.005 % ophthalmic solution INT 1 GTT AEY ONCE D IN THE EVE 08/22/17   [provider]  lisinopril-hydrochlorothiazide (PRINZIDE,ZESTORETIC) 20-12.5 MG tablet Take by mouth. 10/08/17   [provider]  magnesium oxide (MAG-OX) 400 MG tablet Take by mouth. 07/11/17   [provider]  meloxicam (MOBIC) 15 MG tablet Take 15 mg by mouth daily.    [provider]  omeprazole (PRILOSEC) 40 MG capsule Take 40 mg by mouth daily.    [provider]  potassium chloride SA (K-DUR,KLOR-CON) 20 MEQ tablet Take 20 mEq by mouth 2 (two) times daily.    [provider]  simvastatin (ZOCOR) 20 MG tablet Take 20 mg by mouth daily.    [provider]  Family History No family history on file.  Social History Social History   Tobacco Use  . Smoking status: Never Smoker  . Smokeless tobacco: Never Used  Substance Use Topics  . Alcohol use: No    Alcohol/week: 0.0 standard drinks  . Drug use: No     Allergies   Lisinopril   Review of Systems Review of Systems  HENT: Positive for trouble swallowing. Negative for drooling.   Respiratory: Positive for shortness of breath.   Gastrointestinal: Negative for vomiting.  All other systems reviewed and are negative.    Physical Exam Updated Vital  Signs BP (!) 142/89 (BP Location: Right Arm)   Pulse 74   Temp 98.3 F (36.8 C) (Oral)   Resp 18   Ht 5\' 5"  (1.651 m)   Wt 70 kg   SpO2 98%   BMI 25.68 kg/m   Physical Exam Vitals signs and nursing note reviewed.  Constitutional:      Appearance: She is well-developed.  HENT:     Head: Normocephalic and atraumatic.     Mouth/Throat:     Pharynx: Oropharynx is clear. Uvula midline.     Comments: Airway is patent, phonation is intact. Uvula is midline.  No trismus.  No oral angioedema.  Posterior oropharynx is without any erythema, edema. Eyes:     General: No scleral icterus.       Right eye: No discharge.        Left eye: No discharge.     Conjunctiva/sclera: Conjunctivae normal.  Neck:     Musculoskeletal: Full passive range of motion without pain.     Thyroid: Thyromegaly present.     Trachea: Trachea normal.     Comments: No evidence of crepitus noted to neck.  Enlarged thyroid noticed on palpation.  Neck appears symmetric in appearance without any overlying warmth or erythema. Pulmonary:     Effort: Pulmonary effort is normal.     Comments: Lungs clear to auscultation bilaterally.  Symmetric chest rise.  No wheezing, rales, rhonchi. Skin:    General: Skin is warm and dry.  Neurological:     Mental Status: She is alert.  Psychiatric:        Speech: Speech normal.        Behavior: Behavior normal.      ED Treatments / Results  Labs (all labs ordered are listed, but only abnormal results are displayed) Labs Reviewed - No data to display  EKG None  Radiology No results found.  Procedures Procedures (including critical care time)  Medications Ordered in ED Medications - No data to display   Initial Impression / Assessment and Plan / ED Course  I have reviewed the triage vital signs and the nursing notes.  Pertinent labs & imaging results that were available during my care of the patient were reviewed by me and considered in my medical decision making  (see chart for details).        63 y.o. F who presents for evaluation of difficulty swallowing that occurred while at work today.  She reports a history of thyroid swelling for the last year.  Per her records, she has history of a goiter.  She states that it is been stable over last year via imaging.  She felt like today at work, she had some uncomfortable sensation swelling and felt like this led to her difficulty breathing.  On ED arrival, the symptoms have resolved.  She states she has not tried to eat or drink since  this episode at work.  She states that the swelling has not gotten worse and is been constant over the last year.  No swelling of lips, tongue, sore throat, fevers. Patient is afebrile, non-toxic appearing, sitting comfortably on examination table. Vital signs reviewed and stable.  On exam, she has no crepitus of neck.  Airways patent, phonation intact.  Lungs clear to auscultation bilaterally.  On palpation of neck, she does have enlargement of thyroid consistent with goiter.  She is talking without any difficulty and able to tolerate her secretions without any drooling.  Vital signs are stable.  I suspect that this is related to her year-long history of goiter/thyroid swelling.  At this time, her history/physical exam not concerning for Ludwig angina, peritonsillar abscess, retropharyngeal abscess.  Additionally, she has no crepitus that would be concerning for subcutaneous emphysema.  I suspect that when she felt like it was more comfortable, she started becoming more anxious.  Her symptoms here have resolved.  We will plan to p.o. challenge her and reevaluate.  Patient able to tolerate p.o. without any difficulty.  Vitals are stable.  I discussed with patient regarding following up with her doctor as previously directed. Discussed patient with Dr. Pickering who is agreeable. Patient had ample opportunity for questions and discussRubin Payorion. All patient's questions were answered with full  understanding. Strict return precautions discussed. Patient expresses understanding and agreement to plan.   Portions of this note were generated with Scientist, clinical (histocompatibility and immunogenetics)Dragon dictation software. Dictation errors may occur despite best attempts at proofreading.   Final Clinical Impressions(s) / ED Diagnoses   Final diagnoses:  Enlarged thyroid  Dysphagia, unspecified type    ED Discharge Orders    None       Rosana HoesLayden, Mickie Badders A, PA-C 12/24/18 2232    Benjiman CorePickering, Nathan, MD 12/24/18 2326

## 2018-12-24 NOTE — ED Triage Notes (Signed)
Pt states she started having difficulty swallowing and swelling to neck glands today-denies as feeling like an infectious sore throat-states she has hx of swelling to thyroid x years with no new follow up-pt NAD-steady gait

## 2019-04-24 IMAGING — DX DG KNEE COMPLETE 4+V*R*
4 series · 4 of 4 positions shown · non-contrast
Comparison: None.

CLINICAL DATA: Knee pain.  No injury

EXAM:
RIGHT KNEE - COMPLETE 4+ VIEW

[knee ap]
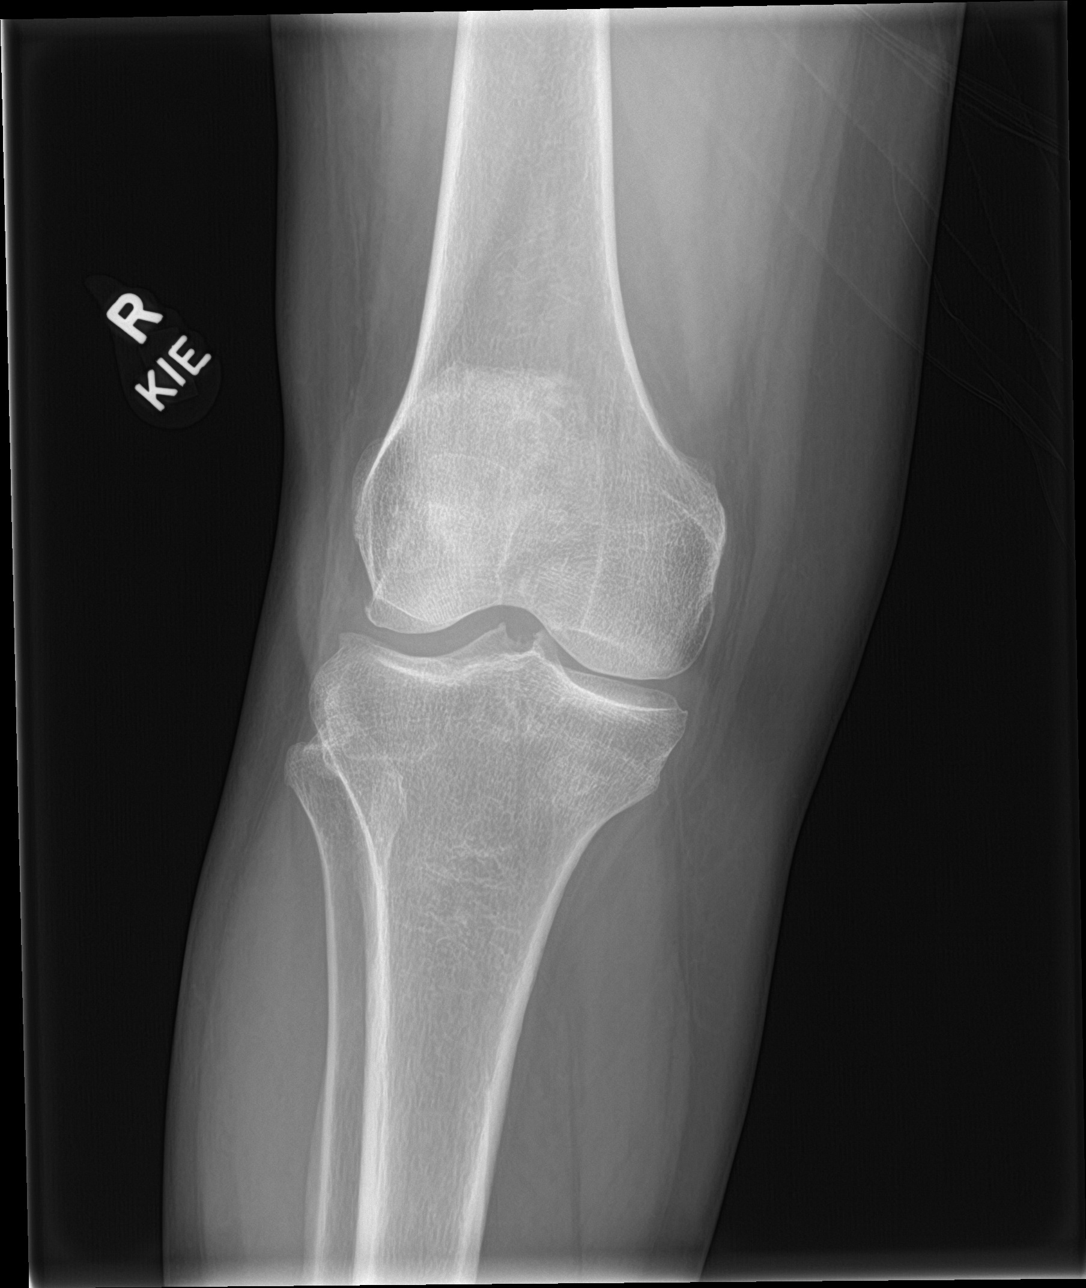

[knee lat]
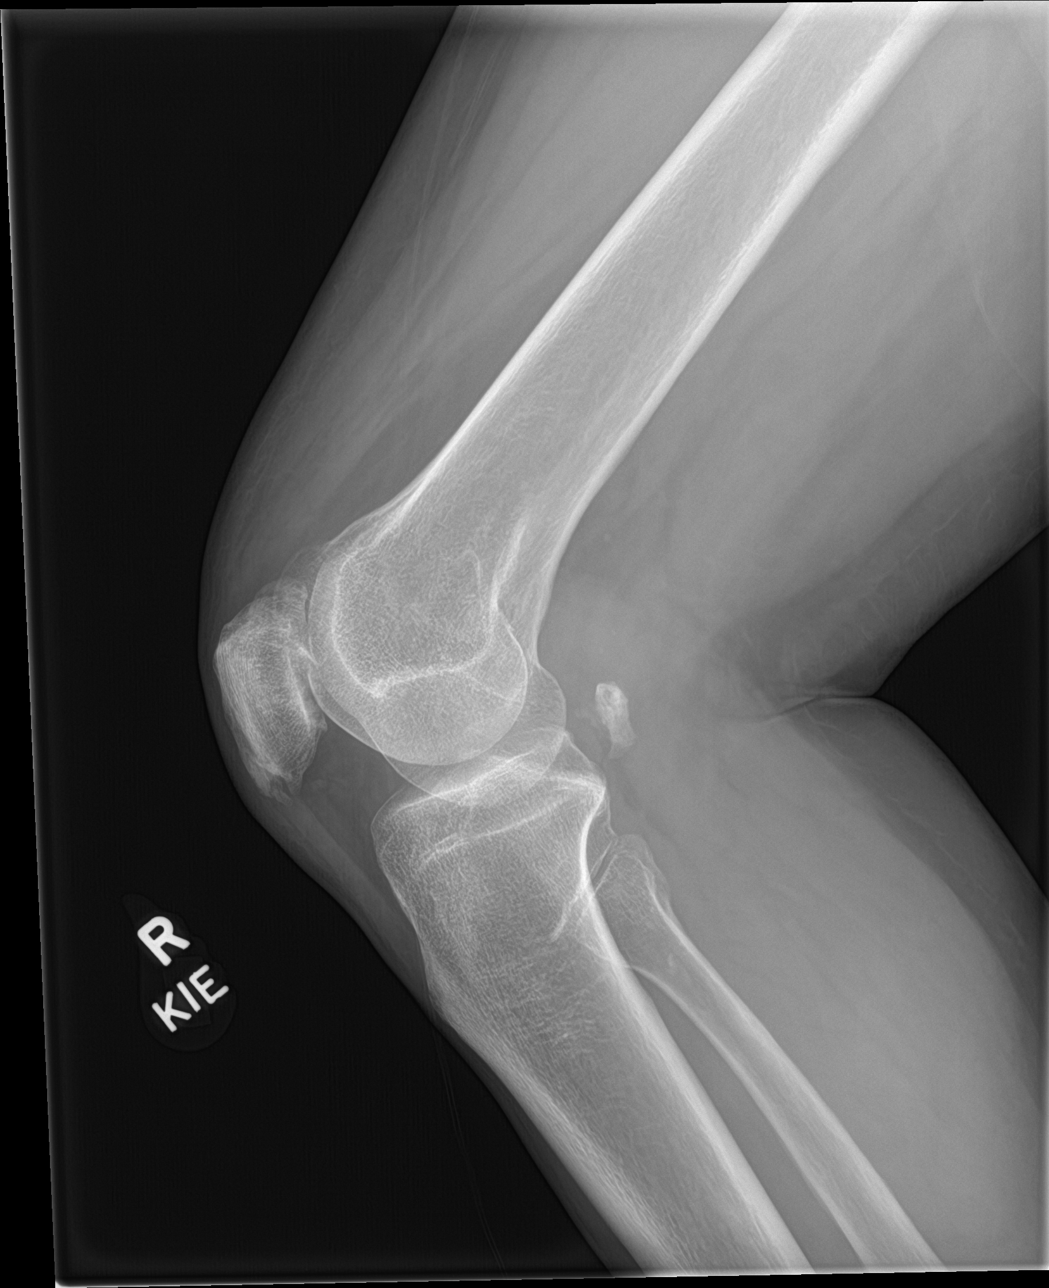

[knee obl (1 of 2)]
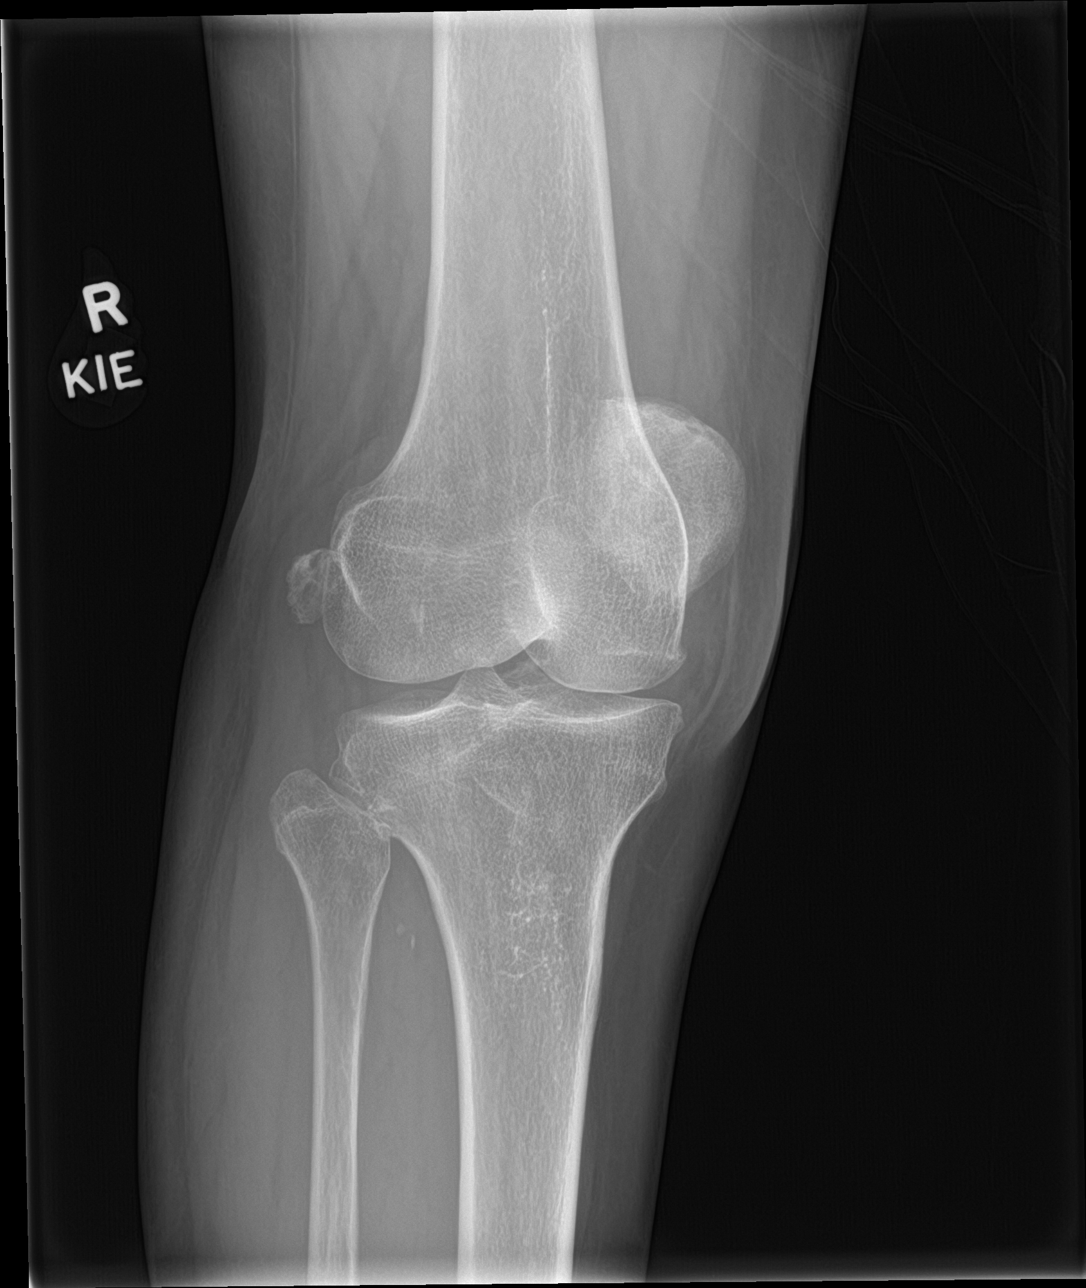

[knee obl (2 of 2)]
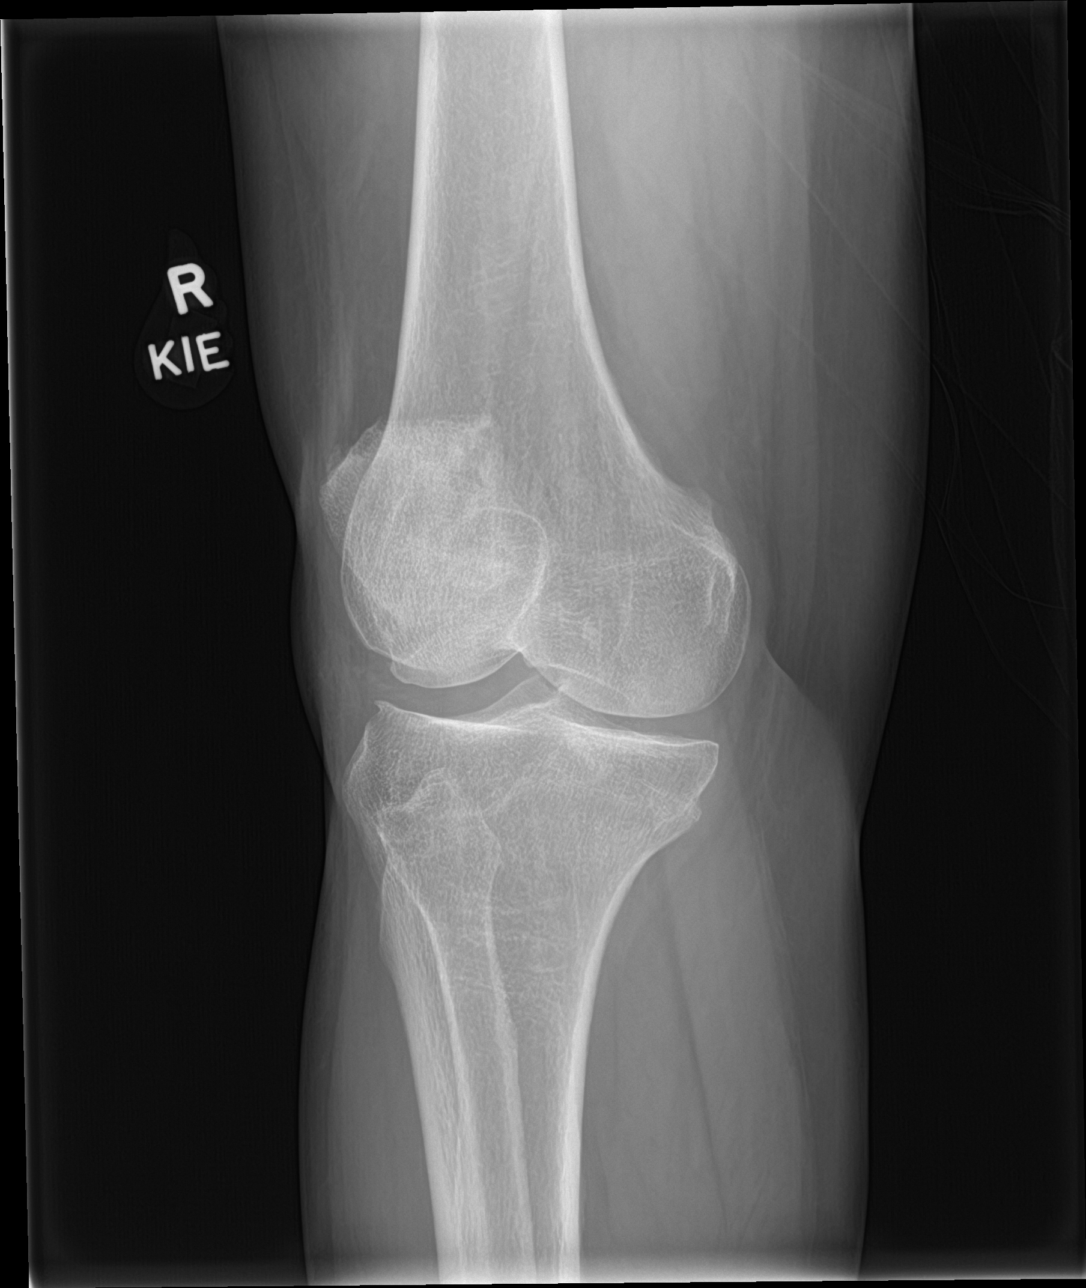

[4 of 4 positions shown; findings below may reference images not displayed]

FINDINGS: Mild degenerative changes, most pronounced in the patellofemoral
compartment with joint space narrowing and spurring. No acute bony
abnormality. Specifically, no fracture, subluxation, or dislocation.
IMPRESSION: Mild-to-moderate degenerative changes, most pronounced in the
patellofemoral compartment. No acute bony abnormality.

## 2019-09-27 ENCOUNTER — Emergency Department (HOSPITAL_BASED_OUTPATIENT_CLINIC_OR_DEPARTMENT_OTHER): Payer: PRIVATE HEALTH INSURANCE

## 2019-09-27 ENCOUNTER — Encounter (HOSPITAL_BASED_OUTPATIENT_CLINIC_OR_DEPARTMENT_OTHER): Payer: Self-pay | Admitting: Emergency Medicine

## 2019-09-27 ENCOUNTER — Emergency Department (HOSPITAL_BASED_OUTPATIENT_CLINIC_OR_DEPARTMENT_OTHER)
Admission: EM | Admit: 2019-09-27 | Discharge: 2019-09-27 | Disposition: A | Discharge status: Home / Self Care | Payer: PRIVATE HEALTH INSURANCE | Attending: Emergency Medicine | Admitting: Emergency Medicine

## 2019-09-27 ENCOUNTER — Other Ambulatory Visit: Payer: Self-pay

## 2019-09-27 DIAGNOSIS — R0789 Other chest pain: Secondary | ICD-10-CM | POA: Insufficient documentation

## 2019-09-27 DIAGNOSIS — I10 Essential (primary) hypertension: Secondary | ICD-10-CM | POA: Diagnosis not present

## 2019-09-27 DIAGNOSIS — Z888 Allergy status to other drugs, medicaments and biological substances status: Secondary | ICD-10-CM | POA: Diagnosis not present

## 2019-09-27 DIAGNOSIS — Z79899 Other long term (current) drug therapy: Secondary | ICD-10-CM | POA: Insufficient documentation

## 2019-09-27 LAB — BASIC METABOLIC PANEL
Anion gap: 7 (ref 5–15)
BUN: 15 mg/dL (ref 8–23)
CO2: 32 mmol/L (ref 22–32)
Calcium: 9.4 mg/dL (ref 8.9–10.3)
Chloride: 102 mmol/L (ref 98–111)
Creatinine, Ser: 0.98 mg/dL (ref 0.44–1.00)
GFR calc Af Amer: >60 mL/min (ref >60)
GFR calc non Af Amer: >60 mL/min (ref >60)
Glucose, Bld: 103 mg/dL — ABNORMAL HIGH (ref 70–99)
Potassium: 3.9 mmol/L (ref 3.5–5.1)
Sodium: 141 mmol/L (ref 135–145)

## 2019-09-27 LAB — CBC
HCT: 44 % (ref 36.0–46.0)
Hemoglobin: 14.5 g/dL (ref 12.0–15.0)
MCH: 29.1 pg (ref 26.0–34.0)
MCHC: 33 g/dL (ref 30.0–36.0)
MCV: 88.2 fL (ref 80.0–100.0)
Platelets: 239 10*3/uL (ref 150–400)
RBC: 4.99 MIL/uL (ref 3.87–5.11)
RDW: 13 % (ref 11.5–15.5)
WBC: 5.7 10*3/uL (ref 4.0–10.5)
nRBC: 0 % (ref 0.0–0.2)

## 2019-09-27 LAB — HEPATIC FUNCTION PANEL
ALT: 28 U/L (ref 0–44)
AST: 29 U/L (ref 15–41)
Albumin: 4.2 g/dL (ref 3.5–5.0)
Alkaline Phosphatase: 66 U/L (ref 38–126)
Bilirubin, Direct: 0.1 mg/dL (ref 0.0–0.2)
Indirect Bilirubin: 0.6 mg/dL (ref 0.3–0.9)
Total Bilirubin: 0.7 mg/dL (ref 0.3–1.2)
Total Protein: 7.9 g/dL (ref 6.5–8.1)

## 2019-09-27 LAB — D-DIMER, QUANTITATIVE: D-Dimer, Quant: 0.49 ug/mL-FEU (ref 0.00–0.50)

## 2019-09-27 LAB — TROPONIN I (HIGH SENSITIVITY)
Troponin I (High Sensitivity): 5 ng/L (ref <18)
Troponin I (High Sensitivity): 5 ng/L (ref <18)

## 2019-09-27 MED ORDER — KETOROLAC TROMETHAMINE 30 MG/ML IJ SOLN
30.0000 mg | Freq: Once | INTRAMUSCULAR | Status: AC
  Administered 2019-09-27: 30 mg via INTRAVENOUS
  Filled 2019-09-27: qty 1

## 2019-09-27 MED ORDER — MELOXICAM 15 MG PO TABS: 15.0000 mg | ORAL_TABLET | Freq: Every day | ORAL | 0 refills | Status: AC

## 2019-09-27 NOTE — ED Triage Notes (Signed)
Intermittent chest pain x 1 week, radiates into her back. Started this morning after reaching up to move a shower curtain.

## 2019-09-27 NOTE — ED Provider Notes (Signed)
[facility] Provider Note   CSN: [id number] Arrival date & time: 09/27/19  9242     History Chief Complaint  Patient presents with  . Chest Pain    Alexa Hernandez is a 64 y.o. female.  Pt presents to the ED today with cp.  The pt said she has had pain intermittently for 1 week.  She said it hurts when she takes a deep breath and when she reaches for something.  Today, she reached for the shower curtain and the pain started again.  She has not taken anything for it.  Pain is there now.  No f/c.  No sob.  No cough.  No known Covid exposures.  She had both doses of the Pfizer vaccine.        Past Medical History:  Diagnosis Date  . Hypertension     Patient Active Problem List   Diagnosis Date Noted  . Mixed hyperlipidemia 10/16/2017  . HTN (hypertension) 10/16/2017  . Small vessel disease, cerebrovascular 10/16/2017  . Family history of colon cancer 12/27/2016  . 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 Multinodular goiter 08/07/2015  . GERD (gastroesophageal reflux disease) 06/24/2015  . Right knee pain 04/15/2014  . Left ear hearing loss 02/26/2012    Past Surgical History:  Procedure Laterality Date  . CESAREAN SECTION       OB History   No obstetric history on file.     No family history on file.  Social History   Tobacco Use  . Smoking status: Never Smoker  . Smokeless tobacco: Never Used  Substance Use Topics  . Alcohol use: No    Alcohol/week: 0.0 standard drinks  . Drug use: No    Home Medications Prior to Admission medications   Medication Sig Start Date End Date Taking? Authorizing Provider  pantoprazole (PROTONIX) 20 MG tablet Take 20 mg by mouth daily.   Yes [provider]  aspirin EC 81 MG tablet Take by mouth.    [provider]  FeFum-FePoly-FA-B Cmp-C-Biot (FOLIVANE-PLUS) CAPS Take 1 capsule by mouth every morning.    [provider]  fexofenadine (ALLEGRA) 60 MG tablet Take by  mouth. 07/11/17   [provider]  fluticasone (FLONASE) 50 MCG/ACT nasal spray Place into the nose. 07/11/17   [provider]  hydrochlorothiazide (HYDRODIURIL) 25 MG tablet Take 25 mg by mouth daily.     [provider]  latanoprost (XALATAN) 0.005 % ophthalmic solution INT 1 GTT AEY ONCE D IN THE EVE 08/22/17   [provider]  lisinopril-hydrochlorothiazide (PRINZIDE,ZESTORETIC) 20-12.5 MG tablet Take by mouth. 10/08/17   [provider]  magnesium oxide (MAG-OX) 400 MG tablet Take by mouth. 07/11/17   [provider]  meloxicam (MOBIC) 15 MG tablet Take 1 tablet (15 mg total) by mouth daily. 09/27/19   Jacalyn Lefevre, MD  omeprazole (PRILOSEC) 40 MG capsule Take 40 mg by mouth daily.    [provider]  potassium chloride SA (K-DUR,KLOR-CON) 20 MEQ tablet Take 20 mEq by mouth 2 (two) times daily.    [provider]  simvastatin (ZOCOR) 20 MG tablet Take 20 mg by mouth daily.    [provider]    Allergies    Lisinopril  Review of Systems   Review of Systems  Cardiovascular: Positive for chest pain.  All other systems reviewed and are negative.   Physical Exam Updated Vital Signs BP (!) 146/81   Pulse 63   Temp 98.4 F (36.9  C) (Oral)   Resp (!) 21   Ht 5\' 6"  (1.676 m)   Wt 70.6 kg   SpO2 100%   BMI 25.12 kg/m   Physical Exam Vitals and nursing note reviewed.  Constitutional:      Appearance: She is well-developed.  HENT:     Head: Normocephalic and atraumatic.  Eyes:     Extraocular Movements: Extraocular movements intact.     Pupils: Pupils are equal, round, and reactive to light.  Cardiovascular:     Rate and Rhythm: Normal rate and regular rhythm.     Heart sounds: Normal heart sounds.  Pulmonary:     Effort: Pulmonary effort is normal.     Breath sounds: Normal breath sounds.  Abdominal:     General: Bowel sounds are normal.     Palpations: Abdomen is soft.  Musculoskeletal:         General: Normal range of motion.     Cervical back: Normal range of motion and neck supple.  Skin:    General: Skin is warm.     Capillary Refill: Capillary refill takes less than 2 seconds.  Neurological:     General: No focal deficit present.     Mental Status: She is alert and oriented to person, place, and time.  Psychiatric:        Mood and Affect: Mood normal.        Behavior: Behavior normal.     ED Results / Procedures / Treatments   Labs (all labs ordered are listed, but only abnormal results are displayed) Labs Reviewed  BASIC METABOLIC PANEL - Abnormal; Notable for the following components:      Result Value   Glucose, Bld 103 (*)    All other components within normal limits  CBC  HEPATIC FUNCTION PANEL  D-DIMER, QUANTITATIVE (NOT AT [facility])  TROPONIN I (HIGH SENSITIVITY)  TROPONIN I (HIGH SENSITIVITY)    EKG EKG Interpretation  Date/Time:  Sunday Sep 27 2019 09:27:54 EDT Ventricular Rate:  77 PR Interval:    QRS Duration: 92 QT Interval:  388 QTC Calculation: 440 R Axis:   81 Text Interpretation: Sinus rhythm Borderline right axis deviation Baseline wander in lead(s) V6 No significant change since last tracing Confirmed by Isla Pence [phone]) on 09/27/2019 9:31:31 AM   Radiology DG Chest 2 View  Result Date: 09/27/2019 CLINICAL DATA:  Intermittent chest pain for 1 week radiating to back, hypertension EXAM: CHEST - 2 VIEW COMPARISON:  11/12/2014 FINDINGS: Normal heart size, mediastinal contours, and pulmonary vascularity. Minimal atherosclerotic calcification at aortic arch. Lungs clear. No pulmonary infiltrate, pleural effusion or pneumothorax. Osseous structures unremarkable. IMPRESSION: No acute abnormalities. Electronically Signed   By: Lavonia Dana M.D.   On: 09/27/2019 10:00    Procedures Procedures (including critical care time)  Medications Ordered in ED Medications  ketorolac (TORADOL) 30 MG/ML injection 30 mg (30 mg Intravenous Given 09/27/19  0949)    ED Course  I have reviewed the triage vital signs and the nursing notes.  Pertinent labs & imaging results that were available during my care of the patient were reviewed by me and considered in my medical decision making (see chart for details).    MDM Rules/Calculators/A&P                      Pt is feeling much better after the toradol.    Heart score:  2 with 2 neg trop.  Pt is stable for d/c.  Return  if worse.  Final Clinical Impression(s) / ED Diagnoses Final diagnoses:  Atypical chest pain    Rx / DC Orders ED Discharge Orders         Ordered    meloxicam (MOBIC) 15 MG tablet  Daily     09/27/19 1251           Jacalyn Lefevre, MD 09/27/19 1253

## 2019-09-27 NOTE — ED Notes (Signed)
Pt discharged to home. Discharge instructions have been discussed with patient and/or family members. Pt verbally acknowledges understanding d/c instructions, and endorses comprehension to checkout at registration before leaving.  °

## 2019-11-05 ENCOUNTER — Other Ambulatory Visit: Payer: Self-pay | Admitting: Internal Medicine

## 2019-11-06 ENCOUNTER — Other Ambulatory Visit: Payer: Self-pay | Admitting: Internal Medicine

## 2019-11-06 DIAGNOSIS — Z1231 Encounter for screening mammogram for malignant neoplasm of breast: Secondary | ICD-10-CM

## 2019-11-25 ENCOUNTER — Ambulatory Visit
Admission: RE | Admit: 2019-11-25 | Discharge: 2019-11-25 | Disposition: A | Discharge status: Home / Self Care | Payer: PRIVATE HEALTH INSURANCE | Source: Ambulatory Visit | Attending: Internal Medicine | Admitting: Internal Medicine

## 2019-11-25 ENCOUNTER — Other Ambulatory Visit: Payer: Self-pay

## 2019-11-25 DIAGNOSIS — Z1231 Encounter for screening mammogram for malignant neoplasm of breast: Secondary | ICD-10-CM

## 2020-11-29 ENCOUNTER — Other Ambulatory Visit: Payer: Self-pay | Admitting: Internal Medicine

## 2020-11-29 ENCOUNTER — Other Ambulatory Visit: Payer: Self-pay

## 2020-11-29 DIAGNOSIS — Z1231 Encounter for screening mammogram for malignant neoplasm of breast: Secondary | ICD-10-CM

## 2020-12-14 ENCOUNTER — Other Ambulatory Visit: Payer: Self-pay | Admitting: Internal Medicine

## 2020-12-14 DIAGNOSIS — Z1231 Encounter for screening mammogram for malignant neoplasm of breast: Secondary | ICD-10-CM

## 2020-12-15 ENCOUNTER — Other Ambulatory Visit: Payer: Self-pay

## 2020-12-15 DIAGNOSIS — Z1231 Encounter for screening mammogram for malignant neoplasm of breast: Secondary | ICD-10-CM

## 2021-01-19 ENCOUNTER — Inpatient Hospital Stay: Admission: RE | Admit: 2021-01-19 | Payer: PRIVATE HEALTH INSURANCE | Source: Ambulatory Visit

## 2021-01-23 ENCOUNTER — Other Ambulatory Visit: Payer: Self-pay

## 2021-01-23 DIAGNOSIS — Z1231 Encounter for screening mammogram for malignant neoplasm of breast: Secondary | ICD-10-CM

## 2021-01-24 ENCOUNTER — Ambulatory Visit
Admission: RE | Admit: 2021-01-24 | Discharge: 2021-01-24 | Disposition: A | Discharge status: Home / Self Care | Payer: No Typology Code available for payment source | Source: Ambulatory Visit | Attending: Internal Medicine | Admitting: Internal Medicine

## 2021-01-24 ENCOUNTER — Other Ambulatory Visit: Payer: Self-pay

## 2021-04-06 ENCOUNTER — Emergency Department (HOSPITAL_BASED_OUTPATIENT_CLINIC_OR_DEPARTMENT_OTHER)
Admission: EM | Admit: 2021-04-06 | Discharge: 2021-04-06 | Disposition: A | Discharge status: Home / Self Care | Payer: Medicare Other | Attending: Emergency Medicine | Admitting: Emergency Medicine

## 2021-04-06 ENCOUNTER — Encounter (HOSPITAL_BASED_OUTPATIENT_CLINIC_OR_DEPARTMENT_OTHER): Payer: Self-pay

## 2021-04-06 ENCOUNTER — Other Ambulatory Visit: Payer: Self-pay

## 2021-04-06 ENCOUNTER — Emergency Department (HOSPITAL_BASED_OUTPATIENT_CLINIC_OR_DEPARTMENT_OTHER): Payer: Medicare Other

## 2021-04-06 DIAGNOSIS — I10 Essential (primary) hypertension: Secondary | ICD-10-CM | POA: Insufficient documentation

## 2021-04-06 DIAGNOSIS — R319 Hematuria, unspecified: Secondary | ICD-10-CM | POA: Diagnosis present

## 2021-04-06 DIAGNOSIS — Z79899 Other long term (current) drug therapy: Secondary | ICD-10-CM | POA: Diagnosis not present

## 2021-04-06 DIAGNOSIS — Z7982 Long term (current) use of aspirin: Secondary | ICD-10-CM | POA: Insufficient documentation

## 2021-04-06 DIAGNOSIS — R310 Gross hematuria: Secondary | ICD-10-CM | POA: Insufficient documentation

## 2021-04-06 LAB — CBC WITH DIFFERENTIAL/PLATELET
Abs Immature Granulocytes: 0.01 10*3/uL (ref 0.00–0.07)
Basophils Absolute: 0 10*3/uL (ref 0.0–0.1)
Basophils Relative: 1 %
Eosinophils Absolute: 0.1 10*3/uL (ref 0.0–0.5)
Eosinophils Relative: 2 %
HCT: 42.2 % (ref 36.0–46.0)
Hemoglobin: 13.9 g/dL (ref 12.0–15.0)
Immature Granulocytes: 0 %
Lymphocytes Relative: 50 %
Lymphs Abs: 2.3 10*3/uL (ref 0.7–4.0)
MCH: 28.4 pg (ref 26.0–34.0)
MCHC: 32.9 g/dL (ref 30.0–36.0)
MCV: 86.3 fL (ref 80.0–100.0)
Monocytes Absolute: 0.5 10*3/uL (ref 0.1–1.0)
Monocytes Relative: 10 %
Neutro Abs: 1.7 10*3/uL (ref 1.7–7.7)
Neutrophils Relative %: 37 %
Platelets: 240 10*3/uL (ref 150–400)
RBC: 4.89 MIL/uL (ref 3.87–5.11)
RDW: 13 % (ref 11.5–15.5)
WBC: 4.6 10*3/uL (ref 4.0–10.5)
nRBC: 0 % (ref 0.0–0.2)

## 2021-04-06 LAB — URINALYSIS, ROUTINE W REFLEX MICROSCOPIC

## 2021-04-06 LAB — URINALYSIS, MICROSCOPIC (REFLEX): RBC / HPF: >50 RBC/hpf (ref 0–5)

## 2021-04-06 LAB — BASIC METABOLIC PANEL
Anion gap: 8 (ref 5–15)
BUN: 18 mg/dL (ref 8–23)
CO2: 25 mmol/L (ref 22–32)
Calcium: 9.3 mg/dL (ref 8.9–10.3)
Chloride: 107 mmol/L (ref 98–111)
Creatinine, Ser: 1.14 mg/dL — ABNORMAL HIGH (ref 0.44–1.00)
GFR, Estimated: 53 mL/min — ABNORMAL LOW (ref >60)
Glucose, Bld: 95 mg/dL (ref 70–99)
Potassium: 3.6 mmol/L (ref 3.5–5.1)
Sodium: 140 mmol/L (ref 135–145)

## 2021-04-06 MED ORDER — CEPHALEXIN 500 MG PO CAPS: 500.0000 mg | ORAL_CAPSULE | Freq: Four times a day (QID) | ORAL | 0 refills | Status: AC

## 2021-04-06 MED ORDER — CEPHALEXIN 250 MG PO CAPS
500.0000 mg | ORAL_CAPSULE | Freq: Once | ORAL | Status: AC
  Administered 2021-04-06: 500 mg via ORAL
  Filled 2021-04-06: qty 2

## 2021-04-06 NOTE — ED Notes (Signed)
ED Provider at bedside. 

## 2021-04-06 NOTE — ED Provider Notes (Signed)
[facility] Provider Note   CSN: [id number] Arrival date & time: 04/06/21  0846     History Chief Complaint  Patient presents with   Hematuria    Alexa Hernandez is a 65 y.o. female.  Pt presents to the ED today with hematuria.  Pt said she noticed some blood in her urine yesterday.  She denies any pains.  The pt is not on blood thinners.      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     Family History  Problem Relation Age of Onset   Breast cancer Neg Hx     Social History   Tobacco Use   Smoking status: Never   Smokeless tobacco: Never  Vaping Use   Vaping Use: Never used  Substance Use Topics   Alcohol use: No    Alcohol/week: 0.0 standard drinks   Drug use: No    Home Medications Prior to Admission medications   Medication Sig Start Date End Date Taking? Authorizing Provider  cephALEXin (KEFLEX) 500 MG capsule Take 1 capsule (500 mg total) by mouth 4 (four) times daily. 04/06/21  Yes Jacalyn Lefevre, MD  aspirin EC 81 MG tablet Take by mouth.    [provider]  FeFum-FePoly-FA-B Cmp-C-Biot (FOLIVANE-PLUS) CAPS Take 1 capsule by mouth every morning.    [provider]  fexofenadine (ALLEGRA) 60 MG tablet Take by mouth. 07/11/17   [provider]  fluticasone (FLONASE) 50 MCG/ACT nasal spray Place into the nose. 07/11/17   [provider]   hydrochlorothiazide (HYDRODIURIL) 25 MG tablet Take 25 mg by mouth daily.     [provider]  latanoprost (XALATAN) 0.005 % ophthalmic solution INT 1 GTT AEY ONCE D IN THE EVE 08/22/17   [provider]  lisinopril-hydrochlorothiazide (PRINZIDE,ZESTORETIC) 20-12.5 MG tablet Take by mouth. 10/08/17   [provider]  magnesium oxide (MAG-OX) 400 MG tablet Take by mouth. 07/11/17   [provider]  meloxicam (MOBIC) 15 MG tablet Take 1 tablet (15 mg total) by mouth daily. 09/27/19   Jacalyn Lefevre, MD  omeprazole (PRILOSEC) 40 MG capsule Take 40 mg by mouth daily.    [provider]  pantoprazole (PROTONIX) 20 MG tablet Take 20 mg by mouth daily.    [provider]  potassium chloride SA (K-DUR,KLOR-CON) 20 MEQ tablet Take 20 mEq by mouth 2 (two) times daily.    [provider]  simvastatin (ZOCOR) 20 MG tablet Take 20 mg by mouth daily.    [provider]    Allergies    Lisinopril  Review of Systems   Review of Systems  Genitourinary:  Positive for hematuria.  All other systems reviewed and are negative.  Physical Exam Updated Vital Signs BP (!) 154/80 (BP Location: Right Arm)   Pulse 65   Temp 98.6 F (37 C) (Oral)   Resp 18   Ht 5\' 7"  (  1.702 m)   Wt 70.8 kg   SpO2 99%   BMI 24.43 kg/m   Physical Exam Vitals and nursing note reviewed.  Constitutional:      Appearance: Normal appearance.  HENT:     Head: Normocephalic and atraumatic.     Right Ear: External ear normal.     Left Ear: External ear normal.     Nose: Nose normal.     Mouth/Throat:     Mouth: Mucous membranes are moist.     Pharynx: Oropharynx is clear.  Eyes:     Extraocular Movements: Extraocular movements intact.     Conjunctiva/sclera: Conjunctivae normal.     Pupils: Pupils are equal, round, and reactive to light.  Cardiovascular:     Rate and Rhythm: Normal rate and regular rhythm.     Pulses: Normal pulses.     Heart sounds:  Normal heart sounds.  Pulmonary:     Effort: Pulmonary effort is normal.     Breath sounds: Normal breath sounds.  Abdominal:     General: Abdomen is flat. Bowel sounds are normal.     Palpations: Abdomen is soft.  Musculoskeletal:        General: Normal range of motion.     Cervical back: Normal range of motion and neck supple.  Skin:    General: Skin is warm.     Capillary Refill: Capillary refill takes less than 2 seconds.  Neurological:     General: No focal deficit present.     Mental Status: She is alert and oriented to person, place, and time.  Psychiatric:        Mood and Affect: Mood normal.        Behavior: Behavior normal.    ED Results / Procedures / Treatments   Labs (all labs ordered are listed, but only abnormal results are displayed) Labs Reviewed  URINALYSIS, ROUTINE W REFLEX MICROSCOPIC - Abnormal; Notable for the following components:      Result Value   Color, Urine BROWN (*)    APPearance TURBID (*)    Glucose, UA   (*)    Value: TEST NOT REPORTED DUE TO COLOR INTERFERENCE OF URINE PIGMENT   Hgb urine dipstick   (*)    Value: TEST NOT REPORTED DUE TO COLOR INTERFERENCE OF URINE PIGMENT   Bilirubin Urine   (*)    Value: TEST NOT REPORTED DUE TO COLOR INTERFERENCE OF URINE PIGMENT   Ketones, ur   (*)    Value: TEST NOT REPORTED DUE TO COLOR INTERFERENCE OF URINE PIGMENT   Protein, ur   (*)    Value: TEST NOT REPORTED DUE TO COLOR INTERFERENCE OF URINE PIGMENT   Nitrite   (*)    Value: TEST NOT REPORTED DUE TO COLOR INTERFERENCE OF URINE PIGMENT   Leukocytes,Ua   (*)    Value: TEST NOT REPORTED DUE TO COLOR INTERFERENCE OF URINE PIGMENT   All other components within normal limits  BASIC METABOLIC PANEL - Abnormal; Notable for the following components:   Creatinine, Ser 1.14 (*)    GFR, Estimated 53 (*)    All other components within normal limits  URINALYSIS, MICROSCOPIC (REFLEX) - Abnormal; Notable for the following components:   Bacteria, UA RARE  (*)    All other components within normal limits  URINE CULTURE  CBC WITH DIFFERENTIAL/PLATELET    EKG None  Radiology CT Renal Stone Study  Result Date: 04/06/2021 CLINICAL DATA:  Gross hematuria beginning yesterday. EXAM: CT ABDOMEN  AND PELVIS WITHOUT CONTRAST TECHNIQUE: Multidetector CT imaging of the abdomen and pelvis was performed following the standard protocol without IV contrast. COMPARISON:  None. FINDINGS: Lower chest: No acute findings. Hepatobiliary: No mass visualized on this unenhanced exam. Gallbladder is unremarkable. No evidence of biliary ductal dilatation. Pancreas: No mass or inflammatory process visualized on this unenhanced exam. Spleen:  Within normal limits in size. Adrenals/Urinary tract: No evidence of urolithiasis or hydronephrosis. Unremarkable unopacified urinary bladder. Stomach/Bowel: No evidence of obstruction, inflammatory process, or abnormal fluid collections. Normal appendix visualized. Vascular/Lymphatic: No pathologically enlarged lymph nodes identified. No evidence of abdominal aortic aneurysm. Aortic atherosclerotic calcification noted. Reproductive:  No mass or other significant abnormality. Other:  None. Musculoskeletal:  No suspicious bone lesions identified. IMPRESSION: No evidence of urolithiasis, hydronephrosis, or other acute findings. Aortic Atherosclerosis (ICD10-I70.0). Electronically Signed   By: Marlaine Hind M.D.   On: 04/06/2021 11:13    Procedures Procedures   Medications Ordered in ED Medications  cephALEXin (KEFLEX) capsule 500 mg (has no administration in time range)    ED Course  I have reviewed the triage vital signs and the nursing notes.  Pertinent labs & imaging results that were available during my care of the patient were reviewed by me and considered in my medical decision making (see chart for details).    MDM Rules/Calculators/A&P                           Pt's urine is grossly bloody.  She will be treated with  keflex in case of infection.  Renal CT unremarkable.  Pt told to f/u with urology.  Return if worse.  Final Clinical Impression(s) / ED Diagnoses Final diagnoses:  Gross hematuria    Rx / DC Orders ED Discharge Orders          Ordered    cephALEXin (KEFLEX) 500 MG capsule  4 times daily        04/06/21 1137             Isla Pence, MD 04/06/21 1139

## 2021-04-06 NOTE — ED Notes (Signed)
Lab notified to add on urine culture 

## 2021-04-06 NOTE — ED Notes (Signed)
Spoke with Alexa Hernandez in lab to add on urine culture

## 2021-04-06 NOTE — ED Triage Notes (Signed)
Pt arrives ambulatory to ED with reports of blood in her urine starting yesterday denies any other symptoms.

## 2021-04-06 NOTE — ED Notes (Signed)
Pt discharged to home. Discharge instructions have been discussed with patient and/or family members. Pt verbally acknowledges understanding d/c instructions, and endorses comprehension to checkout at registration before leaving.  °

## 2021-04-07 LAB — URINE CULTURE
